# Patient Record
Sex: Female | Born: 1985 | Race: White | Hispanic: No | Marital: Married | Smoking: Never smoker
Health system: Southern US, Community
[De-identification: ages and names within clinical notes are randomized; demographics above are authoritative.]

## PROBLEM LIST (undated history)

## (undated) DIAGNOSIS — F32A Depression, unspecified: Secondary | ICD-10-CM

## (undated) DIAGNOSIS — J45909 Unspecified asthma, uncomplicated: Secondary | ICD-10-CM

## (undated) DIAGNOSIS — F329 Major depressive disorder, single episode, unspecified: Secondary | ICD-10-CM

## (undated) DIAGNOSIS — E559 Vitamin D deficiency, unspecified: Secondary | ICD-10-CM

## (undated) DIAGNOSIS — G4730 Sleep apnea, unspecified: Secondary | ICD-10-CM

## (undated) DIAGNOSIS — D509 Iron deficiency anemia, unspecified: Secondary | ICD-10-CM

---

## 2012-09-13 ENCOUNTER — Emergency Department: Payer: Self-pay | Admitting: Emergency Medicine

## 2012-09-13 LAB — URINALYSIS, COMPLETE
Bilirubin,UR: NEGATIVE
Blood: NEGATIVE
Hyaline Cast: 2
Ketone: NEGATIVE
Leukocyte Esterase: NEGATIVE
Nitrite: NEGATIVE
Protein: NEGATIVE
RBC,UR: <1 /HPF (ref 0–5)
Specific Gravity: 1.021 (ref 1.003–1.030)

## 2012-09-13 LAB — COMPREHENSIVE METABOLIC PANEL
Albumin: 3.2 g/dL — ABNORMAL LOW (ref 3.4–5.0)
BUN: 10 mg/dL (ref 7–18)
Bilirubin,Total: 0.3 mg/dL (ref 0.2–1.0)
Co2: 25 mmol/L (ref 21–32)
Creatinine: 0.61 mg/dL (ref 0.60–1.30)
EGFR (African American): >60
Osmolality: 277 (ref 275–301)
SGOT(AST): 16 U/L (ref 15–37)
Sodium: 139 mmol/L (ref 136–145)

## 2012-09-13 LAB — CBC
HCT: 39.1 % (ref 35.0–47.0)
MCHC: 33.4 g/dL (ref 32.0–36.0)
Platelet: 375 10*3/uL (ref 150–440)
RBC: 4.92 10*6/uL (ref 3.80–5.20)

## 2012-12-20 DIAGNOSIS — G43909 Migraine, unspecified, not intractable, without status migrainosus: Secondary | ICD-10-CM | POA: Insufficient documentation

## 2012-12-22 ENCOUNTER — Ambulatory Visit: Payer: Self-pay | Admitting: Neurology

## 2013-05-02 ENCOUNTER — Emergency Department: Payer: Self-pay | Admitting: Emergency Medicine

## 2013-05-02 LAB — URINALYSIS, COMPLETE
Leukocyte Esterase: NEGATIVE
Nitrite: NEGATIVE
Ph: 5 (ref 4.5–8.0)
Protein: 30
RBC,UR: 3 /HPF (ref 0–5)
Squamous Epithelial: 6

## 2013-05-02 LAB — CBC
HCT: 39.3 % (ref 35.0–47.0)
HGB: 13.2 g/dL (ref 12.0–16.0)
WBC: 11.2 10*3/uL — ABNORMAL HIGH (ref 3.6–11.0)

## 2013-05-02 LAB — COMPREHENSIVE METABOLIC PANEL
Albumin: 3.4 g/dL (ref 3.4–5.0)
Alkaline Phosphatase: 87 U/L (ref 50–136)
BUN: 11 mg/dL (ref 7–18)
Bilirubin,Total: 0.5 mg/dL (ref 0.2–1.0)
Chloride: 106 mmol/L (ref 98–107)
Co2: 22 mmol/L (ref 21–32)
Creatinine: 0.8 mg/dL (ref 0.60–1.30)
EGFR (African American): >60
EGFR (Non-African Amer.): >60
Glucose: 85 mg/dL (ref 65–99)
Osmolality: 271 (ref 275–301)
Potassium: 3.6 mmol/L (ref 3.5–5.1)
SGPT (ALT): 20 U/L (ref 12–78)

## 2013-09-26 ENCOUNTER — Emergency Department: Payer: Self-pay | Admitting: Emergency Medicine

## 2014-07-05 HISTORY — PX: LAPAROSCOPIC GASTRIC SLEEVE RESECTION: SHX5895

## 2014-11-10 IMAGING — CT CT ABD-PELV W/ CM
1 of 2 series · 15 of 32 positions shown, 19 images · non-contrast
Comparison: none

REASON FOR EXAM: (1) ruq pain; (2) transverse lower abdominal pain
COMMENTS:

PROCEDURE:     CT  - CT ABDOMEN / PELVIS  W  - May 02, 2013 [DATE]
RESULT:     History: Pain.
Comparison Study: No prior.

[Series 2: 3mm soft tissue · axial · 0.86mm/px · z∈[-552,-96]mm · 15 of 166 slices shown, 19 images]
[im 7/166  soft-tissue]
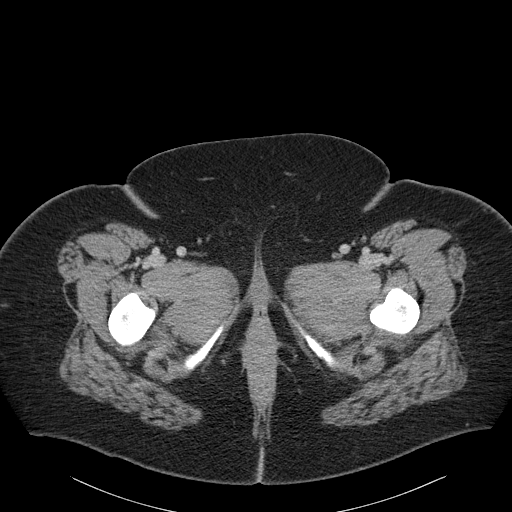
[im 7/166  bone]
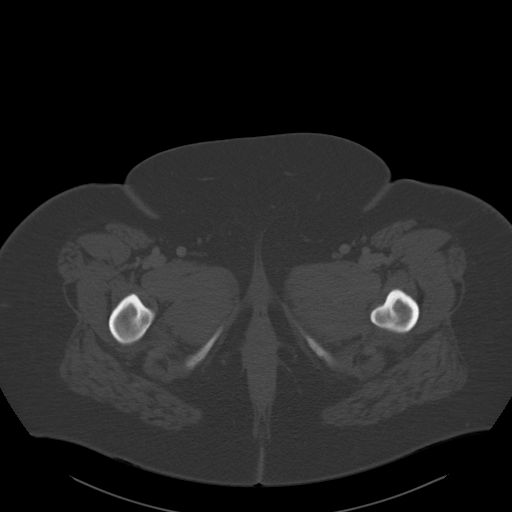
[im 21/166  soft-tissue]
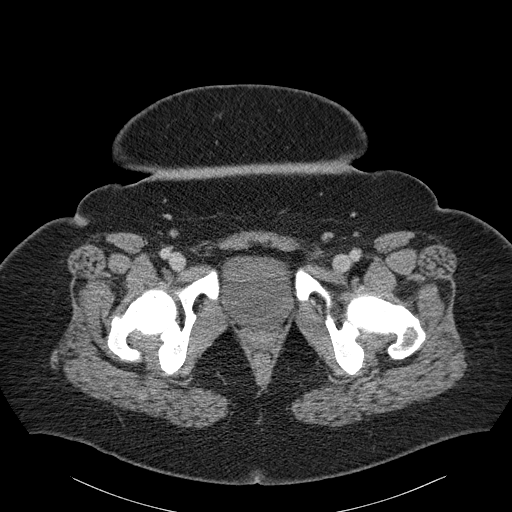
[im 35/166  soft-tissue]
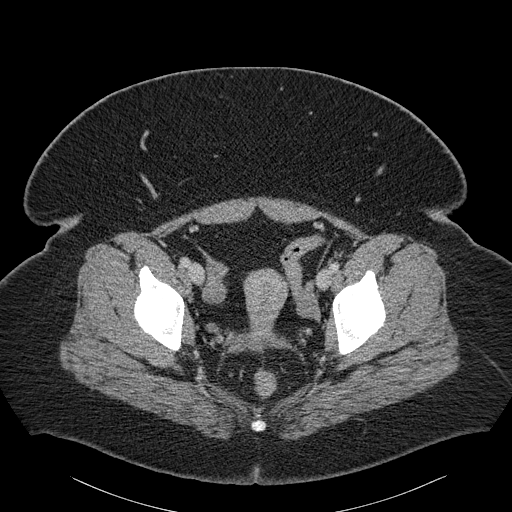
[im 49/166  soft-tissue]
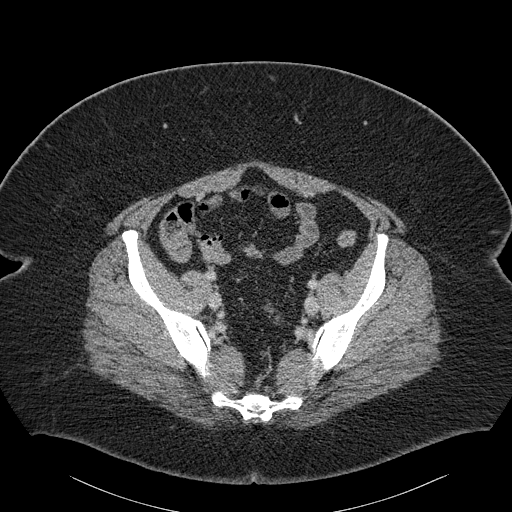
[im 56/166  soft-tissue]
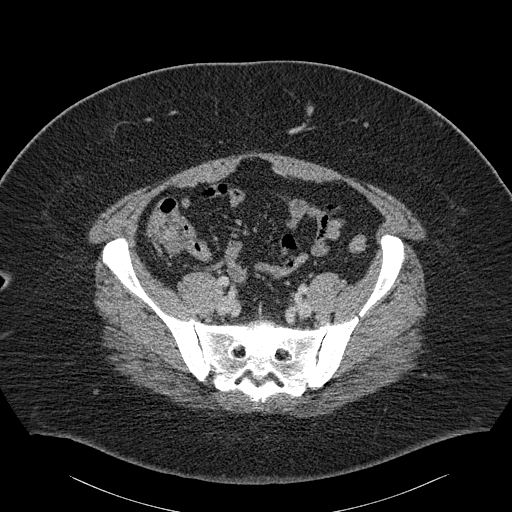
[im 69/166  soft-tissue]
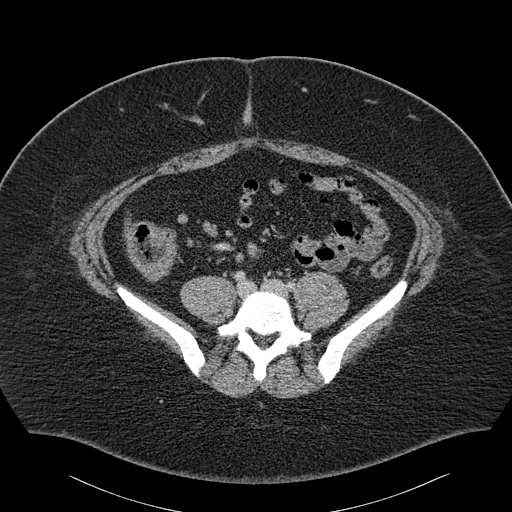
[im 83/166  soft-tissue]
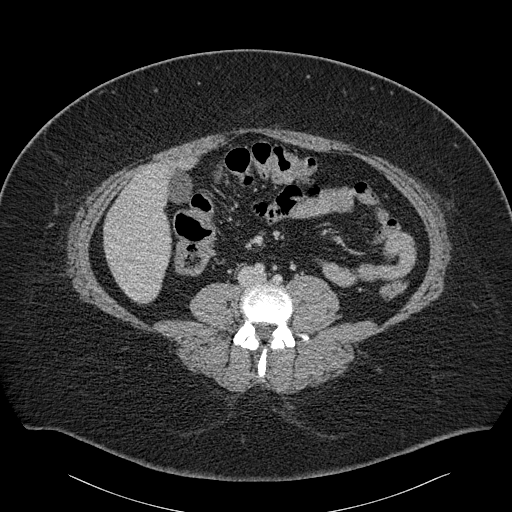
[im 97/166  soft-tissue]
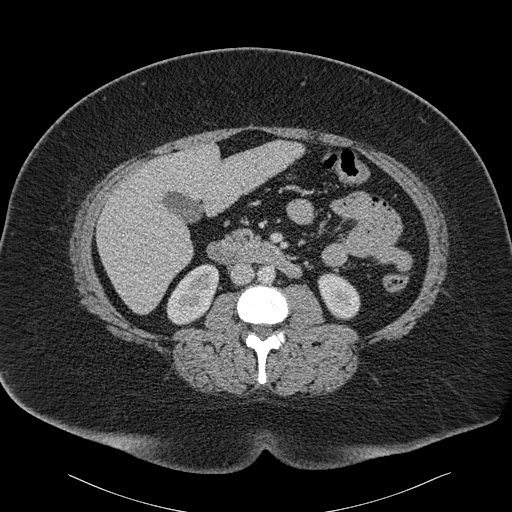
[im 111/166  soft-tissue]
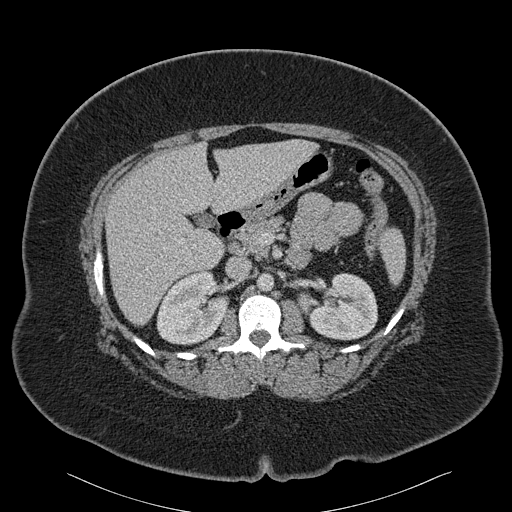
[im 111/166  bone]
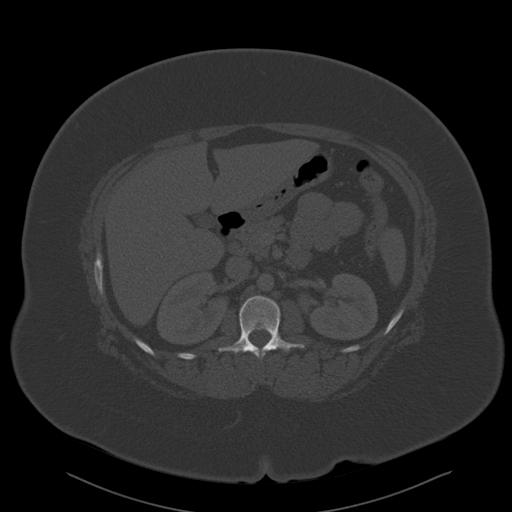
[im 117/166  soft-tissue]
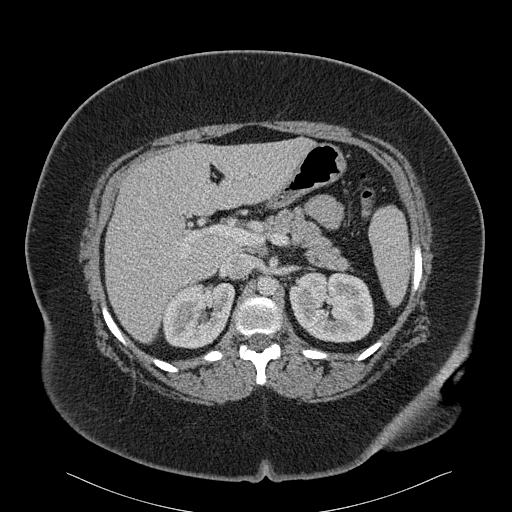
[im 131/166  soft-tissue]
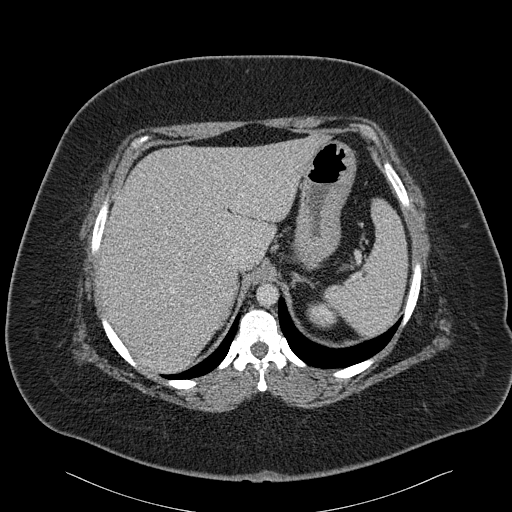
[im 138/166  lung]
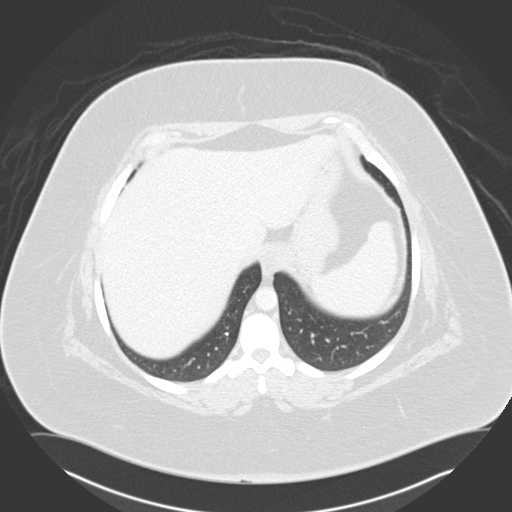
[im 145/166  soft-tissue]
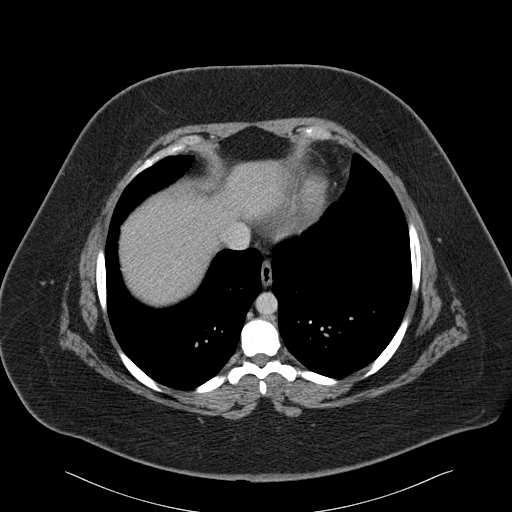
[im 145/166  lung]
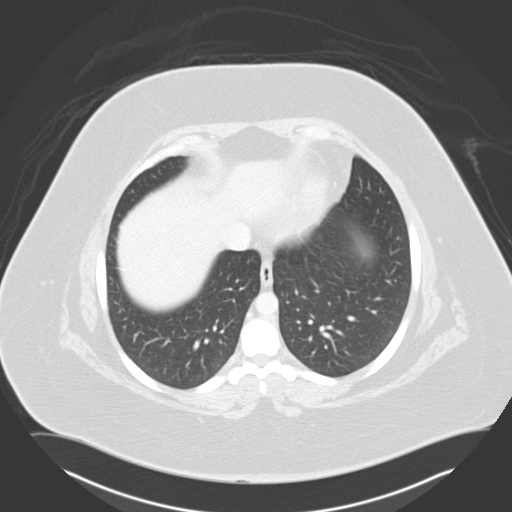
[im 152/166  lung]
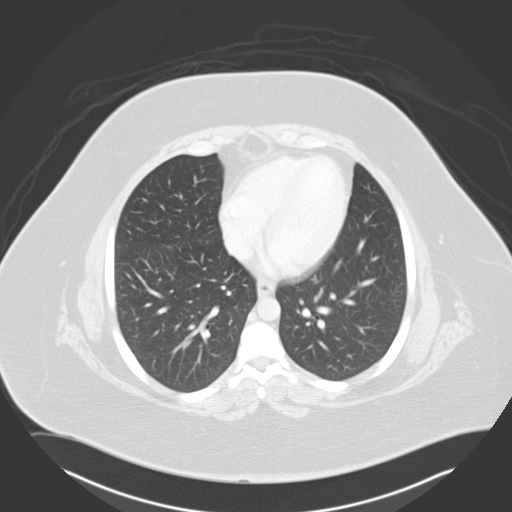
[im 159/166  soft-tissue]
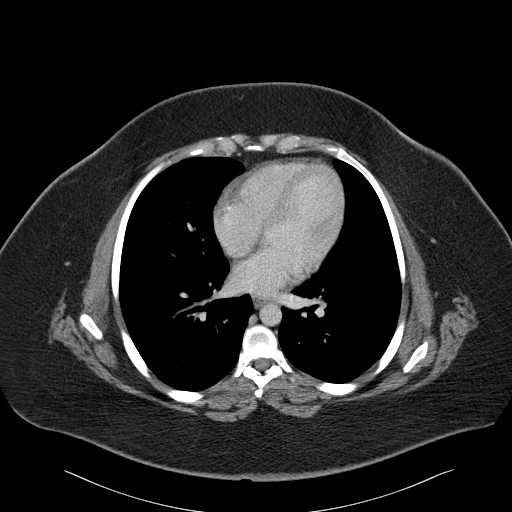
[im 159/166  lung]
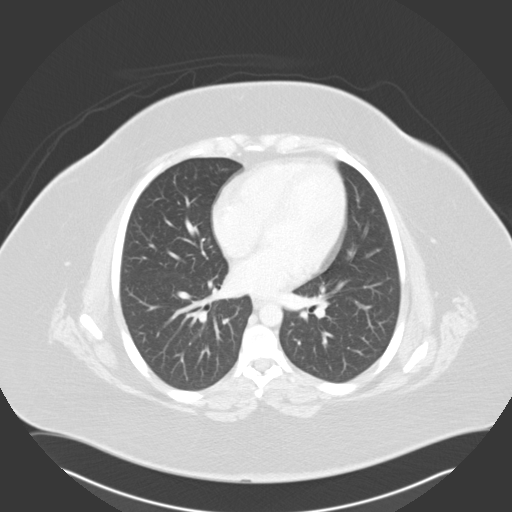

[15 of 32 positions shown; findings below may reference images not displayed]

FINDINGS: Standard CT obtained 100 cc of Bsovue-AD0. Evaluation in 3
dimensions on separate workstation obtained. The liver normal. Spleen
normal. The gallbladder nondistended. No biliary distention. Pancreas is
normal.

Adrenals normal. Kidneys are normal. Bladder is unremarkable. Uterus and
adnexa normal. No free pelvic fluid. Phleboliths.

Small mesenteric lymph nodes noted in the right lower quadrant. Mesenteric
adenitis could present in this fashion. Aorta and its branches are patent.
No aneurysm. Portal vein and splenic vein patent.

Appendix normal. No inflammatory change in right or left or quadrant. No
bowel distention. No free air. Esophagus and stomach are unremarkable.
Duodenum unremarkable.

Lung bases clear. Heart size normal. Abdominal wall intact. No acute bony
abnormality.
IMPRESSION: Findings suggesting mesenteric adenitis.
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
## 2016-08-17 ENCOUNTER — Ambulatory Visit
Admission: EM | Admit: 2016-08-17 | Discharge: 2016-08-17 | Disposition: A | Discharge status: Home / Self Care | Payer: 59 | Attending: Family Medicine | Admitting: Family Medicine

## 2016-08-17 DIAGNOSIS — R112 Nausea with vomiting, unspecified: Secondary | ICD-10-CM

## 2016-08-17 HISTORY — DX: Major depressive disorder, single episode, unspecified: F32.9

## 2016-08-17 HISTORY — DX: Depression, unspecified: F32.A

## 2016-08-17 HISTORY — DX: Unspecified asthma, uncomplicated: J45.909

## 2016-08-17 HISTORY — DX: Vitamin D deficiency, unspecified: E55.9

## 2016-08-17 HISTORY — DX: Sleep apnea, unspecified: G47.30

## 2016-08-17 HISTORY — DX: Iron deficiency anemia, unspecified: D50.9

## 2016-08-17 MED ORDER — ONDANSETRON 8 MG PO TBDP
8.0000 mg | ORAL_TABLET | Freq: Once | ORAL | Status: AC
  Administered 2016-08-17: 8 mg via ORAL

## 2016-08-17 MED ORDER — ONDANSETRON 8 MG PO TBDP: 8.0000 mg | ORAL_TABLET | Freq: Two times a day (BID) | ORAL | 0 refills | Status: AC

## 2016-08-17 NOTE — ED Provider Notes (Signed)
CSN: [id number]     Arrival date & time 08/17/16  1016 History   First MD Initiated Contact with Patient 08/17/16 1407     Chief Complaint  Patient presents with  . Emesis   (Consider location/radiation/quality/duration/timing/severity/associated sxs/prior Treatment) HPI  31 year old female who presents with nausea vomiting fatigue sweating and fevers the last 3 days. She states that it seems to be worsening. 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 The onset has been gradual over several days yesterday having the first episodes of nausea and vomiting. She has not had diarrhea although states that the stools have been sticky but without any blood or mucus. Not had any known sick contacts. Patient denies any abdominal pain        Past Medical History:  Diagnosis Date  . Asthma   . Depression   . Iron deficiency anemia   . Sleep apnea   . Vitamin D deficiency    Past Surgical History:  Procedure Laterality Date  . LAPAROSCOPIC GASTRIC SLEEVE RESECTION  2016   Family History  Problem Relation Age of Onset  . Hypertension Mother   . Epilepsy Mother   . Multiple sclerosis Mother   . Depression Mother   . Diabetes Mother   . Diverticulitis Mother   . Diabetes Father    Social History  Substance Use Topics  . Smoking status: Never Smoker  . Smokeless tobacco: Never Used  . Alcohol use No   OB History    No data available     Review of Systems  Constitutional: Positive for activity change, appetite change, chills, fatigue and fever.  Gastrointestinal: Positive for diarrhea, nausea and vomiting. Negative for abdominal distention, abdominal pain, blood in stool and constipation.  All other systems reviewed and are negative.   Allergies  Measles and rubella virus vaccine and Reglan [metoclopramide]  Home Medications   Prior to Admission medications   Medication Sig Start Date End Date Taking? Authorizing Provider   albuterol (PROVENTIL HFA;VENTOLIN HFA) 108 (90 Base) MCG/ACT inhaler Inhale into the lungs every 6 (six) hours as needed for wheezing or shortness of breath.   Yes Historical Provider, MD  amitriptyline (ELAVIL) 50 MG tablet Take 50 mg by mouth at bedtime.   Yes Historical Provider, MD  cholecalciferol (VITAMIN D) 1000 units tablet Take 1,000 Units by mouth daily.   Yes Historical Provider, MD  DULoxetine (CYMBALTA) 60 MG capsule Take 60 mg by mouth daily.   Yes Historical Provider, MD  ferrous sulfate 325 (65 FE) MG EC tablet Take 325 mg by mouth 3 (three) times daily with meals.   Yes Historical Provider, MD  norethindrone-ethinyl estradiol (JUNEL FE,GILDESS FE,LOESTRIN FE) 1-20 MG-MCG tablet Take 1 tablet by mouth daily.   Yes Historical Provider, MD  omeprazole (PRILOSEC) 20 MG capsule Take 20 mg by mouth daily.   Yes Historical Provider, MD  ondansetron (ZOFRAN ODT) 8 MG disintegrating tablet Take 1 tablet (8 mg total) by mouth 2 (two) times daily. 08/17/16   Lutricia FeilWilliam P Shae Augello, PA-C   Meds Ordered and Administered this Visit   Medications  ondansetron (ZOFRAN-ODT) disintegrating tablet 8 mg (8 mg Oral Given 08/17/16 1420)    BP (!) 148/88 (BP Location: Left Arm)   Pulse 92   Temp 98.2 F (36.8 C) (Oral)   Resp 16   Ht 5\' 3"  (1.6 m)   Wt (!) 320 lb (145.2 kg)   LMP 08/03/2016   SpO2 100%  BMI 56.69 kg/m  Orthostatic VS for the past 24 hrs:  BP- Lying Pulse- Lying BP- Sitting Pulse- Sitting BP- Standing at 0 minutes Pulse- Standing at 0 minutes  08/17/16 1421 (!) 161/94 88 (!) 153/98 94 (!) 168/102 91    Physical Exam  Constitutional: She appears well-developed and well-nourished. No distress.  HENT:  Head: Normocephalic and atraumatic.  Right Ear: External ear normal.  Left Ear: External ear normal.  Nose: Nose normal.  Mouth/Throat: Oropharynx is clear and moist. No oropharyngeal exudate.  Eyes: EOM are normal. Pupils are equal, round, and reactive to light.  Neck: Normal  range of motion. Neck supple.  Pulmonary/Chest: Effort normal and breath sounds normal. No respiratory distress. She has no wheezes. She has no rales.  Abdominal: Soft. Bowel sounds are normal. She exhibits no distension and no mass. There is tenderness. There is no rebound and no guarding.  Patient has mild tenderness in the right mid quadrant and periumbilical. There is no rebound or guarding present. Ipatient is extremely obese.  Lymphadenopathy:    She has no cervical adenopathy.  Skin: She is not diaphoretic.  Nursing note and vitals reviewed.   Urgent Care Course     Procedures (including critical care time)  Labs Review Labs Reviewed - No data to display  Imaging Review No results found.   Visual Acuity Review  Right Eye Distance:   Left Eye Distance:   Bilateral Distance:    Right Eye Near:   Left Eye Near:    Bilateral Near:     Medications  ondansetron (ZOFRAN-ODT) disintegrating tablet 8 mg (8 mg Oral Given 08/17/16 1420)      MDM   1. Non-intractable vomiting with nausea, unspecified vomiting type    Discharge Medication List as of 08/17/2016  3:05 PM    START taking these medications   Details  ondansetron (ZOFRAN ODT) 8 MG disintegrating tablet Take 1 tablet (8 mg total) by mouth 2 (two) times daily., Starting Tue 08/17/2016, Normal      Plan: 1. Test/x-ray results and diagnosis reviewed with patient 2. rx as per orders; risks, benefits, potential side effects reviewed with patient 3. Recommend supportive treatment with fluids fluids fluids. Use Zofran for nausea. He does decide to resume food to start with a BRAT diet. If she is not improving she should follow-up with her primary care doctor Dayna Barker. She has high fevers increasing abdominal pain or intractable vomiting she should go to the emergency room. 4. F/u prn if symptoms worsen or don't improve     Lutricia Feil, PA-C 08/17/16 1513

## 2016-08-17 NOTE — ED Triage Notes (Signed)
Patient complains of nausea, vomiting, fatigue, sweating, fevers that started a few days ago and have been worsening. Patient states that symptoms have been constant.

## 2018-10-06 DIAGNOSIS — F419 Anxiety disorder, unspecified: Secondary | ICD-10-CM | POA: Insufficient documentation

## 2020-03-05 DIAGNOSIS — I10 Essential (primary) hypertension: Secondary | ICD-10-CM | POA: Insufficient documentation

## 2020-11-24 NOTE — Progress Notes (Signed)
[facility] [address] [place], [place] [address]  Pulmonary Sleep Medicine   Office Visit Note  Patient Name: Debra Morales DOB: 06-01-1986 MRN [id number]  I connected with  Barnet Glasgow on 11/25/20 by a video enabled telemedicine application and verified that I am speaking with the correct person using two identifiers.   I discussed the limitations of evaluation and management by telemedicine. The patient expressed understanding and agreed to proceed.   Chief Complaint: Obstructive Sleep Apnea visit  Brief History:  Arlette is seen today for initial consult to obtain a replacement CPAP machine. Her current machine is malfunctioning and is no longer recording data.  This machine is a little over 4 years old. Keeps telling her there is a clog when there is not and the air pattern is irregular.  Stopped recording in April. Motor hours have been going up but the therapy compliance data is not recording. The patient has a 10 year history of sleep apnea. Patient is using PAP nightly.  The patient feels better after sleeping with PAP.  The patient reports benefit from PAP use. Reported sleepiness is  Improved and the Epworth Sleepiness Score is 7 out of 24. The patient does take naps on weekends. The patient complains of the following: machine not operating properly.  The compliance download shows 83% compliance with an average use time of 8.9 hours. The AHI is 0.9.  The patient does not complain of limb movements disrupting sleep.  ROS  General: (-) fever, (-) chills, (-) night sweat Nose and Sinuses: (-) nasal stuffiness or itchiness, (-) postnasal drip, (-) nosebleeds, (-) sinus trouble. Mouth and Throat: (-) sore throat, (-) hoarseness. Neck: (-) swollen glands, (-) enlarged thyroid, (-) neck pain. Respiratory: - cough, - shortness of breath, - wheezing. Neurologic: + numbness, + tingling. Psychiatric: + anxiety, + depression   Current  Medication: Outpatient Encounter Medications as of 11/25/2020  Medication Sig  . ARIPiprazole (ABILIFY) 5 MG tablet Take 1/2 tab PO daily for 4 days, then 1 tab PO daily thereafter  . clonazePAM (KLONOPIN) 0.5 MG tablet Take 2 tablets PO QAM, 1 tablet PO Qnoon, and 1 tablet PO QHS  . DULoxetine (CYMBALTA) 30 MG capsule Take by mouth.  . hydrochlorothiazide (HYDRODIURIL) 25 MG tablet Take 1 tablet by mouth daily.  . valsartan (DIOVAN) 160 MG tablet Take by mouth.  Marland Kitchen albuterol (PROVENTIL HFA;VENTOLIN HFA) 108 (90 Base) MCG/ACT inhaler Inhale into the lungs every 6 (six) hours as needed for wheezing or shortness of breath.  Marland Kitchen amitriptyline (ELAVIL) 50 MG tablet Take 50 mg by mouth at bedtime.  Marland Kitchen aspirin-acetaminophen-caffeine (EXCEDRIN MIGRAINE) 250-250-65 MG tablet Take by mouth.  . cholecalciferol (VITAMIN D) 1000 units tablet Take 1,000 Units by mouth daily.  . DULoxetine (CYMBALTA) 60 MG capsule Take 60 mg by mouth daily.  . ferrous sulfate 325 (65 FE) MG EC tablet Take 325 mg by mouth 3 (three) times daily with meals.  . norethindrone-ethinyl estradiol (JUNEL FE,GILDESS FE,LOESTRIN FE) 1-20 MG-MCG tablet Take 1 tablet by mouth daily.  Marland Kitchen omeprazole (PRILOSEC) 20 MG capsule Take 20 mg by mouth daily.  . ondansetron (ZOFRAN ODT) 8 MG disintegrating tablet Take 1 tablet (8 mg total) by mouth 2 (two) times daily.   No facility-administered encounter medications on file as of 11/25/2020.    Surgical History: Past Surgical History:  Procedure Laterality Date  . LAPAROSCOPIC GASTRIC SLEEVE RESECTION  2016    Medical History: Past Medical History:  Diagnosis Date  .  Asthma   . Depression   . Iron deficiency anemia   . Sleep apnea   . Vitamin D deficiency     Family History: Non contributory to the present illness  Social History: Social History   Socioeconomic History  . Marital status: Married    Spouse name: Not on file  . Number of children: Not on file  . Years of education:  Not on file  . Highest education level: Not on file  Occupational History  . Not on file  Tobacco Use  . Smoking status: Never Smoker  . Smokeless tobacco: Never Used  Substance and Sexual Activity  . Alcohol use: No  . Drug use: No  . Sexual activity: Not on file  Other Topics Concern  . Not on file  Social History Narrative  . Not on file   Social Determinants of Health   Financial Resource Strain: Not on file  Food Insecurity: Not on file  Transportation Needs: Not on file  Physical Activity: Not on file  Stress: Not on file  Social Connections: Not on file  Intimate Partner Violence: Not on file    Vital Signs: There were no vitals taken for this visit.  Examination: General Appearance: The patient is well-developed, well-nourished, and in no distress. Neck Circumference:  Skin: Gross inspection of skin unremarkable. Head: normocephalic, no gross deformities. Eyes: no gross deformities noted. ENT: ears appear grossly normal Neurologic: Alert and oriented. No involuntary movements.    EPWORTH SLEEPINESS SCALE:  Scale:  (0)= no chance of dozing; (1)= slight chance of dozing; (2)= moderate chance of dozing; (3)= high chance of dozing  Chance  Situtation    Sitting and reading: 1    Watching TV: 3    Sitting Inactive in public: 0    As a passenger in car: 1      Lying down to rest: 2    Sitting and talking: 0    Sitting quielty after lunch: 0    In a car, stopped in traffic: 0   TOTAL SCORE:   7 out of 24    SLEEP STUDIES:  1. Split 05/13/11    CPAP COMPLIANCE DATA:  Date Range: 11/25/19-5/22-22  Average Daily Use: 8.9 hours  Median Use: 8.3  Compliance for > 4 Hours: 83%   AHI: 0.9 respiratory events per hour  Days Used: 83%  Mask Leak: 11.8  95th Percentile Pressure: 10         LABS: No results found for this or any previous visit (from the past 2160 hour(s)).  Radiology: No results found.  No results  found.  No results found.    Assessment and Plan: Patient Active Problem List   Diagnosis Date Noted  . Asthma, stable 11/25/2020  . Depression 11/25/2020  . Benign essential HTN 03/05/2020  . Anxiety 10/06/2018  . Migraines 12/20/2012      The patient does tolerate PAP and reports benefit from PAP use. The patient was reminded how to adjust mask fit and advised to change supplies. The patient was also counselled on nightly use. The compliance is excellent. The AHI is 0.9. Machine is in need of repair and patient plans to try for repair and rent a machine in the meantime, though if repair not feasible and machine is past end of life will need to be replaced. Will need to schedule a 30+day follow up after setup if a replacement machine is ordered.   1. OSA (obstructive sleep apnea) Continue excellent compliance.  2. CPAP use counseling CPAP couseling-Discussed importance of adequate CPAP use as well as proper care and cleaning techniques of machine and all supplies.  3. Benign essential HTN Continue current medication and f/u with PCP.  4. Mild intermittent stable asthma Continue inhaler as prescribed  5. Moderate episode of recurrent major depressive disorder (HCC) Followed by psych  6. Migraine without status migrainosus, not intractable, unspecified migraine type Continue current medication and f/u with PCP.   General Counseling: I have discussed the findings of the evaluation and examination with Darlisa.  I have also discussed any further diagnostic evaluation thatmay be needed or ordered today. Rhaelyn verbalizes understanding of the findings of todays visit. We also reviewed her medications today and discussed drug interactions and side effects including but not limited excessive drowsiness and altered mental states. We also discussed that there is always a risk not just to her but also people around her. she has been encouraged to call the office with any questions or  concerns that should arise related to todays visit.  No orders of the defined types were placed in this encounter.       I have personally obtained a history, examined the patient, evaluated laboratory and imaging results, formulated the assessment and plan and placed orders.  This patient was seen by Lynn Ito, PA-C in collaboration with Dr. Freda Munro as a part of collaborative care agreement.   Valentino Hue Sol Blazing, PhD, FAASM  Diplomate, American Board of Sleep Medicine    Yevonne Pax, MD [facility] Diplomate ABMS Pulmonary and Critical Care Medicine Sleep medicine

## 2020-11-25 ENCOUNTER — Ambulatory Visit (INDEPENDENT_AMBULATORY_CARE_PROVIDER_SITE_OTHER): Payer: No Typology Code available for payment source | Admitting: Internal Medicine

## 2020-11-25 DIAGNOSIS — G4733 Obstructive sleep apnea (adult) (pediatric): Secondary | ICD-10-CM | POA: Diagnosis not present

## 2020-11-25 DIAGNOSIS — Z7189 Other specified counseling: Secondary | ICD-10-CM | POA: Diagnosis not present

## 2020-11-25 DIAGNOSIS — I10 Essential (primary) hypertension: Secondary | ICD-10-CM

## 2020-11-25 DIAGNOSIS — G43909 Migraine, unspecified, not intractable, without status migrainosus: Secondary | ICD-10-CM

## 2020-11-25 DIAGNOSIS — J4520 Mild intermittent asthma, uncomplicated: Secondary | ICD-10-CM | POA: Diagnosis not present

## 2020-11-25 DIAGNOSIS — F331 Major depressive disorder, recurrent, moderate: Secondary | ICD-10-CM

## 2020-11-25 DIAGNOSIS — J45909 Unspecified asthma, uncomplicated: Secondary | ICD-10-CM | POA: Insufficient documentation

## 2020-11-25 DIAGNOSIS — F32A Depression, unspecified: Secondary | ICD-10-CM | POA: Insufficient documentation

## 2020-11-25 NOTE — Patient Instructions (Signed)

## 2021-05-19 ENCOUNTER — Other Ambulatory Visit: Payer: Self-pay | Admitting: Neurology

## 2021-05-19 DIAGNOSIS — R200 Anesthesia of skin: Secondary | ICD-10-CM

## 2021-05-19 DIAGNOSIS — R519 Headache, unspecified: Secondary | ICD-10-CM

## 2021-05-19 DIAGNOSIS — R208 Other disturbances of skin sensation: Secondary | ICD-10-CM

## 2021-05-21 ENCOUNTER — Other Ambulatory Visit: Payer: Self-pay

## 2021-05-21 ENCOUNTER — Ambulatory Visit
Admission: RE | Admit: 2021-05-21 | Discharge: 2021-05-21 | Disposition: A | Discharge status: Home / Self Care | Payer: No Typology Code available for payment source | Source: Ambulatory Visit | Attending: Neurology | Admitting: Neurology

## 2021-05-21 DIAGNOSIS — R200 Anesthesia of skin: Secondary | ICD-10-CM | POA: Insufficient documentation

## 2021-05-21 DIAGNOSIS — R208 Other disturbances of skin sensation: Secondary | ICD-10-CM | POA: Insufficient documentation

## 2021-05-21 DIAGNOSIS — R519 Headache, unspecified: Secondary | ICD-10-CM | POA: Insufficient documentation

## 2021-06-25 ENCOUNTER — Other Ambulatory Visit: Payer: Self-pay | Admitting: Neurology

## 2021-06-25 DIAGNOSIS — M5416 Radiculopathy, lumbar region: Secondary | ICD-10-CM

## 2021-06-25 DIAGNOSIS — G8929 Other chronic pain: Secondary | ICD-10-CM

## 2021-07-24 ENCOUNTER — Ambulatory Visit
Admission: RE | Admit: 2021-07-24 | Discharge: 2021-07-24 | Disposition: A | Discharge status: Home / Self Care | Payer: 59 | Source: Ambulatory Visit | Attending: Neurology | Admitting: Neurology

## 2021-07-24 ENCOUNTER — Other Ambulatory Visit: Payer: Self-pay

## 2021-07-24 DIAGNOSIS — G8929 Other chronic pain: Secondary | ICD-10-CM

## 2021-07-24 DIAGNOSIS — M5416 Radiculopathy, lumbar region: Secondary | ICD-10-CM

## 2021-09-30 ENCOUNTER — Ambulatory Visit
Payer: 59 | Attending: Student in an Organized Health Care Education/Training Program | Admitting: Student in an Organized Health Care Education/Training Program

## 2021-09-30 ENCOUNTER — Encounter: Payer: Self-pay | Admitting: Student in an Organized Health Care Education/Training Program

## 2021-09-30 VITALS — BP 148/88 | HR 92 | Temp 98.1°F | Resp 16 | Ht 63.0 in | Wt 356.0 lb

## 2021-09-30 DIAGNOSIS — M5417 Radiculopathy, lumbosacral region: Secondary | ICD-10-CM | POA: Diagnosis not present

## 2021-09-30 DIAGNOSIS — M5416 Radiculopathy, lumbar region: Secondary | ICD-10-CM | POA: Insufficient documentation

## 2021-09-30 DIAGNOSIS — M5126 Other intervertebral disc displacement, lumbar region: Secondary | ICD-10-CM | POA: Insufficient documentation

## 2021-09-30 NOTE — Progress Notes (Signed)
Patient: Debra Morales  Service Category: E/M  Provider: Gillis Santa, MD  ?DOB: 11-20-1985  DOS: 09/30/2021  Referring Provider: Doyle Askew, MD  ?MRN: [id number]  Setting: Ambulatory outpatient  PCP: Gayland Curry, MD  ?Type: New Patient  Specialty: Interventional Pain Management    ?Location: Office  Delivery: Face-to-face    ? ?Primary Reason(s) for Visit: Encounter for initial evaluation of one or more chronic problems (new to examiner) potentially causing chronic pain, and posing a threat to normal musculoskeletal function. (Level of risk: High) ?CC: Back Pain (lower) ? ?HPI  ?Debra Morales is a 36 y.o. year old, female patient, who comes for the first time to our practice referred by Girtha Hake I, MD for our initial evaluation of her chronic pain. She has Anxiety; Asthma, stable; Benign essential HTN; Depression; Migraines; Lumbar radiculopathy; Protrusion of lumbar intervertebral disc; and Lumbosacral radiculopathy at L5 (right) on their problem list. Today she comes in for evaluation of her Back Pain (lower) ? ?Pain Assessment: ?Location:   Back ?Radiating: right hip and right leg ?Onset: More than a month ago ?Duration: Chronic pain ?Quality: Aching, Sharp ?Severity: 6 /10 (subjective, self-reported pain score)  ?Effect on ADL: difficulty performing daily activities ?Timing: Constant ?Modifying factors: positioning ?BP: (!) 148/88  HR: 92 ? ?Onset and Duration: Gradual and Present longer than 3 months ?Cause of pain: Unknown ?Severity: No change since onset, NAS-11 at its worse: 9/10, NAS-11 at its best: 4/10, NAS-11 now: 10/10, and NAS-11 on the average: 6/10 ?Timing: Afternoon, Night, During activity or exercise, and After activity or exercise ?Aggravating Factors: Bending, Intercourse (sex), Kneeling, Lifiting, Prolonged sitting, Prolonged standing, Squatting, Stooping , Twisting, Walking, Walking uphill, and Walking downhill ?Alleviating Factors: Stretching, Lying down,  Resting, and physical therapy ?Associated Problems: Day-time cramps, Depression, Fatigue, Inability to concentrate, Numbness, Spasms, Swelling, Tingling, Weakness, Pain that wakes patient up, and Pain that does not allow patient to sleep ?Quality of Pain: Aching, Agonizing, Burning, Constant, Deep, Pressure-like, Pulsating, Sharp, Shooting, Stabbing, and Tingling ?Previous Examinations or Tests: CT scan, MRI scan, and Nerve conduction test ?Previous Treatments: Physical Therapy ? ?Debra Morales is a pleasant 36 year old female, history of morbid obesity, who presents as a referral from Dr. Alba Destine for right L5 transforaminal epidural steroid injection given her body habitus.  Patient is complaining of low back pain that radiates down her right leg.  This is been going on for approximately 6 months.  No inciting or traumatic event.  She does have intermittent numbness and tingling along her right posterior lateral leg.  No bowel or bladder dysfunction.  She has been working with physical therapy.  She has tried naproxen, ibuprofen, Tylenol, gabapentin with limited benefit.  She is currently on gabapentin managed by Dr. Alba Destine.  She also has a history of migraines, depression and anxiety. ? ?I informed the patient that I will be assisting with her transforaminal epidural steroid injection and that she will follow back up with Dr. Alba Destine for further management. ? ?Meds  ? ?Current Outpatient Medications:  ?  albuterol (PROVENTIL HFA;VENTOLIN HFA) 108 (90 Base) MCG/ACT inhaler, Inhale into the lungs every 6 (six) hours as needed for wheezing or shortness of breath., Disp: , Rfl:  ?  ARIPiprazole (ABILIFY) 5 MG tablet, 10 mg 2 (two) times daily., Disp: , Rfl:  ?  ascorbic acid (VITAMIN C) 500 MG tablet, Take by mouth., Disp: , Rfl:  ?  aspirin-acetaminophen-caffeine (EXCEDRIN MIGRAINE) 250-250-65 MG tablet, Take by mouth., Disp: , Rfl:  ?  azelastine (ASTELIN) 0.1 % nasal spray, Place into the nose., Disp: , Rfl:  ?   clonazePAM (KLONOPIN) 0.5 MG tablet, 0.5 mg. 1/2 tab two times per day and prn, Disp: , Rfl:  ?  DULoxetine (CYMBALTA) 30 MG capsule, Take by mouth daily., Disp: , Rfl:  ?  DULoxetine (CYMBALTA) 60 MG capsule, Take 60 mg by mouth daily., Disp: , Rfl:  ?  ferrous sulfate 325 (65 FE) MG EC tablet, Take 325 mg by mouth daily., Disp: , Rfl:  ?  gabapentin (NEURONTIN) 300 MG capsule, Take 300 mg by mouth 3 (three) times daily. 1 in a.m., 1 at noon, 2 at night, Disp: , Rfl:  ?  hydrochlorothiazide (HYDRODIURIL) 25 MG tablet, Take 1 tablet by mouth daily., Disp: , Rfl:  ?  norethindrone-ethinyl estradiol (JUNEL FE,GILDESS FE,LOESTRIN FE) 1-20 MG-MCG tablet, Take 1 tablet by mouth daily., Disp: , Rfl:  ?  nortriptyline (PAMELOR) 50 MG capsule, Take 50 mg by mouth at bedtime. 25 mg in a.m., 75 mg at p.m., Disp: , Rfl:  ?  omeprazole (PRILOSEC) 20 MG capsule, Take 20 mg by mouth daily., Disp: , Rfl:  ?  Prenatal 28-0.8 MG TABS, Take by mouth., Disp: , Rfl:  ?  topiramate (TOPAMAX) 25 MG tablet, TAKE 25 MG AT NIGHT FOR A WEEK THEN INCREASE TO 50 MG AT NIGHT FOR A WEEK THEN INCREASE TO 75 MG AT NIGHT, Disp: , Rfl:  ?  amitriptyline (ELAVIL) 50 MG tablet, Take 50 mg by mouth at bedtime. (Patient not taking: Reported on 09/30/2021), Disp: , Rfl:  ?  cholecalciferol (VITAMIN D) 1000 units tablet, Take 1,000 Units by mouth daily. (Patient not taking: Reported on 09/30/2021), Disp: , Rfl:  ?  hydrochlorothiazide (HYDRODIURIL) 25 MG tablet, Take 1 tablet by mouth daily., Disp: , Rfl:  ?  ondansetron (ZOFRAN ODT) 8 MG disintegrating tablet, Take 1 tablet (8 mg total) by mouth 2 (two) times daily. (Patient not taking: Reported on 09/30/2021), Disp: 6 tablet, Rfl: 0 ?  valsartan (DIOVAN) 160 MG tablet, Take by mouth., Disp: , Rfl:  ? ?Imaging Review  ? ?MR LUMBAR SPINE WO CONTRAST ? ?Narrative ?CLINICAL DATA:  Chronic low back pain on the right side. The patient ?developed burning sensation, numbness and weakness in right thigh ?over the  past 3-4 months. No known injury. ? ?EXAM: ?MRI LUMBAR SPINE WITHOUT CONTRAST ? ?TECHNIQUE: ?Multiplanar, multisequence MR imaging of the lumbar spine was ?performed. No intravenous contrast was administered. ? ?COMPARISON:  None. ? ?FINDINGS: ?Segmentation:  Standard. ? ?Alignment:  Normal. ? ?Vertebrae:  No fracture, evidence of discitis, or bone lesion. ? ?Conus medullaris and cauda equina: Conus extends to the T12-L1 ?level. Conus and cauda equina appear normal. ? ?Paraspinal and other soft tissues: Negative. ? ?Disc levels: ? ?T11-12 to L2-3 are negative. ? ?L3-4: A right paracentral disc protrusion indents the ventral thecal ?sac and could irritate the descending right L3 root. The foramina ?are open. ? ?L4-5: A right paracentral disc protrusion indents the ventral thecal ?sac and encroaches on the descending right L5 root. The neural ?foramina are open. ? ?L5-S1: Annular fissure and a shallow disc bulge without stenosis. ? ?IMPRESSION: ?A right paracentral disc protrusion at L4-5 impinges on the ?descending right L5 root and indents the ventral thecal sac. The ?neural foramina are open. ? ?A right paracentral disc protrusion at L3-4 mildly indents the ?ventral thecal sac and could irritate the descending right L3 root. ? ? ?Electronically Signed ?By: Drusilla Kanner  M.D. ?On: 07/24/2021 10:06 ?Complexity Note: Imaging results reviewed. Results shared with Ms. Reyman, using Layman's terms.              ?          ? ?ROS  ?Cardiovascular: High blood pressure ?Pulmonary or Respiratory: Wheezing and difficulty taking a deep full breath (Asthma), Shortness of breath, Snoring , and Temporary stoppage of breathing during sleep ?Neurological: Curved spine ?Psychological-Psychiatric: Anxiousness, Depressed, and Prone to panicking ?Gastrointestinal: Reflux or heatburn ?Genitourinary: Difficulty emptying the bladder or controlling the flow of urine (Neurogenic bladder) ?Hematological: Weakness due to low blood  hemoglobin or red blood cell count (Anemia) ?Endocrine: No reported endocrine signs or symptoms such as high or low blood sugar, rapid heart rate due to high thyroid levels, obesity or weight gain due to slow thyroid or thy

## 2021-09-30 NOTE — Patient Instructions (Signed)

## 2021-09-30 NOTE — Progress Notes (Signed)
Safety precautions to be maintained throughout the outpatient stay will include: orient to surroundings, keep bed in low position, maintain call bell within reach at all times, provide assistance with transfer out of bed and ambulation.  

## 2021-10-12 ENCOUNTER — Ambulatory Visit (HOSPITAL_BASED_OUTPATIENT_CLINIC_OR_DEPARTMENT_OTHER): Payer: 59 | Admitting: Student in an Organized Health Care Education/Training Program

## 2021-10-12 ENCOUNTER — Encounter: Payer: Self-pay | Admitting: Student in an Organized Health Care Education/Training Program

## 2021-10-12 ENCOUNTER — Ambulatory Visit
Admission: RE | Admit: 2021-10-12 | Discharge: 2021-10-12 | Disposition: A | Discharge status: Home / Self Care | Payer: 59 | Source: Ambulatory Visit | Attending: Student in an Organized Health Care Education/Training Program | Admitting: Student in an Organized Health Care Education/Training Program

## 2021-10-12 VITALS — BP 155/102 | HR 91 | Temp 98.1°F | Resp 26 | Ht 63.0 in | Wt 356.0 lb

## 2021-10-12 DIAGNOSIS — M5116 Intervertebral disc disorders with radiculopathy, lumbar region: Secondary | ICD-10-CM | POA: Diagnosis present

## 2021-10-12 DIAGNOSIS — M5417 Radiculopathy, lumbosacral region: Secondary | ICD-10-CM | POA: Diagnosis not present

## 2021-10-12 DIAGNOSIS — M5416 Radiculopathy, lumbar region: Secondary | ICD-10-CM | POA: Diagnosis not present

## 2021-10-12 DIAGNOSIS — M5126 Other intervertebral disc displacement, lumbar region: Secondary | ICD-10-CM | POA: Diagnosis not present

## 2021-10-12 MED ORDER — DEXAMETHASONE SODIUM PHOSPHATE 10 MG/ML IJ SOLN
INTRAMUSCULAR | Status: AC
Start: 2021-10-12 — End: ?
  Filled 2021-10-12: qty 1

## 2021-10-12 MED ORDER — LIDOCAINE HCL 2 % IJ SOLN
20.0000 mL | Freq: Once | INTRAMUSCULAR | Status: AC
  Administered 2021-10-12: 400 mg

## 2021-10-12 MED ORDER — IOHEXOL 180 MG/ML  SOLN
10.0000 mL | Freq: Once | INTRAMUSCULAR | Status: AC
  Administered 2021-10-12: 10 mL via EPIDURAL
  Filled 2021-10-12: qty 20

## 2021-10-12 MED ORDER — LIDOCAINE HCL 2 % IJ SOLN
INTRAMUSCULAR | Status: AC
Start: 2021-10-12 — End: ?
  Filled 2021-10-12: qty 20

## 2021-10-12 MED ORDER — SODIUM CHLORIDE 0.9% FLUSH
1.0000 mL | Freq: Once | INTRAVENOUS | Status: AC
  Administered 2021-10-12: 1 mL

## 2021-10-12 MED ORDER — ROPIVACAINE HCL 2 MG/ML IJ SOLN
INTRAMUSCULAR | Status: AC
  Filled 2021-10-12: qty 20

## 2021-10-12 MED ORDER — ROPIVACAINE HCL 2 MG/ML IJ SOLN
1.0000 mL | Freq: Once | INTRAMUSCULAR | Status: AC
  Administered 2021-10-12: 1 mL via EPIDURAL

## 2021-10-12 MED ORDER — DEXAMETHASONE SODIUM PHOSPHATE 10 MG/ML IJ SOLN
10.0000 mg | Freq: Once | INTRAMUSCULAR | Status: AC
  Administered 2021-10-12: 10 mg

## 2021-10-12 MED ORDER — SODIUM CHLORIDE (PF) 0.9 % IJ SOLN
INTRAMUSCULAR | Status: AC
  Filled 2021-10-12: qty 10

## 2021-10-12 NOTE — Progress Notes (Signed)
PROVIDER NOTE: Interpretation of information contained herein should be left to medically-trained personnel. Specific patient instructions are provided elsewhere under "Patient Instructions" section of medical record. This document was created in part using STT-dictation technology, any transcriptional errors that may result from this process are unintentional.  ?Patient: Debra Morales ?Type: Established ?DOB: Jun 06, 1986 ?MRN: [id number] ?PCP: Leim Fabry, MD  Service: Procedure ?DOS: 10/12/2021 ?Setting: Ambulatory ?Location: Ambulatory outpatient facility ?Delivery: Face-to-face Provider: Edward Jolly, MD ?Specialty: Interventional Pain Management ?Specialty designation: 09 ?Location: Outpatient facility ?Ref. Prov.: Leim Fabry, MD   ? ?Primary Reason for Visit: Interventional Pain Management Treatment. ?CC: Back Pain (lower) ?  ?Procedure:          ? Type: Trans-Foraminal Epidural Steroid Injection (Lumbar) #1  ?Laterality: Right  ?Level: L5           ?Imaging: Fluoroscopic guidance ?Anesthesia: Local anesthesia (1-2% Lidocaine) ?Anxiolysis: None                 ?Sedation: None. ?DOS: 10/12/2021  ?Performed by: Edward Jolly, MD ? ?Purpose: Diagnostic/Therapeutic ?Indications: Lumbar radicular pain severe enough to impact quality of life or function. ?1. Lumbar radiculopathy   ?2. Protrusion of lumbar intervertebral disc   ?3. Lumbosacral radiculopathy at L5 (right)   ? ?NAS-11 Pain score:  ? Pre-procedure: 4 /10  ? Post-procedure: 3 /10  ? ?  ?Position / Prep / Materials:  ?Position: Prone  ?Prep solution: DuraPrep (Iodine Povacrylex [0.7% available iodine] and Isopropyl Alcohol, 74% w/w) ?Prep Area: Entire Posterior Lumbosacral Area.  From the lower tip of the scapula down to the tailbone and from flank to flank. ?Materials:  ?Tray: Block ?Needle(s):  ?Type: Spinal  ?Gauge (G): 22  ?Length: 7.0-in  ?Qty: 1 ? ?Pre-op H&P Assessment:  ?Debra Morales is a 36 y.o. (year old), female patient, seen  today for interventional treatment. She  has a past surgical history that includes Laparoscopic gastric sleeve resection (2016). Debra Morales has a current medication list which includes the following prescription(s): albuterol, aripiprazole, ascorbic acid, excedrin migraine, azelastine, clonazepam, duloxetine, duloxetine, ferrous sulfate, gabapentin, norethindrone-ethinyl estradiol-fe, nortriptyline, omeprazole, prenatal, topiramate, amitriptyline, cholecalciferol, hydrochlorothiazide, hydrochlorothiazide, ondansetron, and valsartan. Her primarily concern today is the Back Pain (lower) ? ?Initial Vital Signs:  ?Pulse/HCG Rate: 91ECG Heart Rate: 92 ?Temp: 98.1 ?F (36.7 ?C) ?Resp: 16 ?BP:  ?(!) 142/90 ?SpO2: 99 % ? ?BMI: Estimated body mass index is 63.06 kg/m? as calculated from the following: ?  Height as of this encounter: 5\' 3"  (1.6 m). ?  Weight as of this encounter: 356 lb (161.5 kg). ? ?Risk Assessment: ?Allergies: Reviewed. She is allergic to measles and rubella virus vaccine and reglan [metoclopramide].  ?Allergy Precautions: None required ?Coagulopathies: Reviewed. None identified.  ?Blood-thinner therapy: None at this time ?Active Infection(s): Reviewed. None identified. Debra Morales is afebrile ? ?Site Confirmation: Debra Morales was asked to confirm the procedure and laterality before marking the site ?Procedure checklist: Completed ?Consent: Before the procedure and under the influence of no sedative(s), amnesic(s), or anxiolytics, the patient was informed of the treatment options, risks and possible complications. To fulfill our ethical and legal obligations, as recommended by the [employer] Association's Code of Ethics, I have informed the patient of my clinical impression; the nature and purpose of the treatment or procedure; the risks, benefits, and possible complications of the intervention; the alternatives, including doing nothing; the risk(s) and benefit(s) of the alternative treatment(s) or  procedure(s); and the risk(s) and benefit(s) of doing nothing. ?The patient was  provided information about the general risks and possible complications associated with the procedure. These may include, but are not limited to: failure to achieve desired goals, infection, bleeding, organ or nerve damage, allergic reactions, paralysis, and death. ?In addition, the patient was informed of those risks and complications associated to Spine-related procedures, such as failure to decrease pain; infection (i.e.: Meningitis, epidural or intraspinal abscess); bleeding (i.e.: epidural hematoma, subarachnoid hemorrhage, or any other type of intraspinal or peri-dural bleeding); organ or nerve damage (i.e.: Any type of peripheral nerve, nerve root, or spinal cord injury) with subsequent damage to sensory, motor, and/or autonomic systems, resulting in permanent pain, numbness, and/or weakness of one or several areas of the body; allergic reactions; (i.e.: anaphylactic reaction); and/or death. ?Furthermore, the patient was informed of those risks and complications associated with the medications. These include, but are not limited to: allergic reactions (i.e.: anaphylactic or anaphylactoid reaction(s)); adrenal axis suppression; blood sugar elevation that in diabetics may result in ketoacidosis or comma; water retention that in patients with history of congestive heart failure may result in shortness of breath, pulmonary edema, and decompensation with resultant heart failure; weight gain; swelling or edema; medication-induced neural toxicity; particulate matter embolism and blood vessel occlusion with resultant organ, and/or nervous system infarction; and/or aseptic necrosis of one or more joints. ?Finally, the patient was informed that Medicine is not an exact science; therefore, there is also the possibility of unforeseen or unpredictable risks and/or possible complications that may result in a catastrophic outcome. The patient  indicated having understood very clearly. We have given the patient no guarantees and we have made no promises. Enough time was given to the patient to ask questions, all of which were answered to the patient's satisfaction. Debra Morales has indicated that she wanted to continue with the procedure. ?Attestation: I, the ordering provider, attest that I have discussed with the patient the benefits, risks, side-effects, alternatives, likelihood of achieving goals, and potential problems during recovery for the procedure that I have provided informed consent. ?Date  Time: 10/12/2021  8:32 AM ? ?Pre-Procedure Preparation:  ?Monitoring: As per clinic protocol. Respiration, ETCO2, SpO2, BP, heart rate and rhythm monitor placed and checked for adequate function ?Safety Precautions: Patient was assessed for positional comfort and pressure points before starting the procedure. ?Time-out: I initiated and conducted the "Time-out" before starting the procedure, as per protocol. The patient was asked to participate by confirming the accuracy of the "Time Out" information. Verification of the correct person, site, and procedure were performed and confirmed by me, the nursing staff, and the patient. "Time-out" conducted as per Joint Commission's Universal Protocol (UP.01.01.01). ?Time: 5462 ? ?Description/Narrative of Procedure:          ?Target: The 6 o'clock position under the pedicle, on the affected side. ?Region: Posterolateral Lumbosacral ?Approach: Posterior Percutaneous Paravertebral approach. ? ?Rationale (medical necessity): procedure needed and proper for the diagnosis and/or treatment of the patient's medical symptoms and needs. ?Procedural Technique Safety Precautions: Aspiration looking for blood return was conducted prior to all injections. At no point did we inject any substances, as a needle was being advanced. No attempts were made at seeking any paresthesias. Safe injection practices and needle disposal techniques  used. Medications properly checked for expiration dates. SDV (single dose vial) medications used. ?Description of the Procedure: Protocol guidelines were followed. The patient was placed in position over

## 2021-10-12 NOTE — Patient Instructions (Signed)

## 2021-10-13 ENCOUNTER — Telehealth: Payer: Self-pay

## 2021-10-13 NOTE — Telephone Encounter (Signed)
Post procedure phone call. Patient states she is doing good.  

## 2021-11-10 ENCOUNTER — Ambulatory Visit
Payer: 59 | Attending: Student in an Organized Health Care Education/Training Program | Admitting: Student in an Organized Health Care Education/Training Program

## 2021-11-10 ENCOUNTER — Encounter: Payer: Self-pay | Admitting: Student in an Organized Health Care Education/Training Program

## 2021-11-10 DIAGNOSIS — M5126 Other intervertebral disc displacement, lumbar region: Secondary | ICD-10-CM | POA: Diagnosis not present

## 2021-11-10 DIAGNOSIS — M5416 Radiculopathy, lumbar region: Secondary | ICD-10-CM

## 2021-11-10 DIAGNOSIS — M5417 Radiculopathy, lumbosacral region: Secondary | ICD-10-CM

## 2021-11-10 NOTE — Assessment & Plan Note (Signed)
Positive response to initial right L5 transforaminal ESI.  Patient is noticing return of pain.  Recommend repeating right L5 transforaminal ESI and adding additional volume. ?

## 2021-11-10 NOTE — Progress Notes (Signed)
Patient: Debra Morales  Service Category: E/M  Provider: Gillis Santa, MD  ?DOB: [birth year]  DOS: 11/10/2021  Location: Office  ?MRN: [id number]  Setting: Ambulatory outpatient  Referring Provider: Gayland Curry, MD  ?Type: Established Patient  Specialty: Interventional Pain Management  PCP: Gayland Curry, MD  ?Location: Remote location  Delivery: TeleHealth    ? ?Virtual Encounter - Pain Management ?PROVIDER NOTE: Information contained herein reflects review and annotations entered in association with encounter. Interpretation of such information and data should be left to medically-trained personnel. Information provided to patient can be located elsewhere in the medical record under "Patient Instructions". Document created using STT-dictation technology, any transcriptional errors that may result from process are unintentional.  ?  ?Contact & Pharmacy ?Preferred: [phone] ?Home: [phone] (home) ?Mobile: [phone] (mobile) ?E-mail: [email or web address]  ?[facility] #[id number]$- [place] [facility] [address] ?[address] ?[facility]?Phone: [phone]: [phone]?  ?Pre-screening  ?Ms. [phone] "in-person" vs "virtual" encounter. She indicated preferring virtual for this encounter.  ? ?Reason ?COVID-19*  Social distancing based on CDC and AMA recommendations.  ? ?I contacted KShela Leffon 11/10/2021 via telephone.      I clearly identified myself as B18/03/2022 MD. I verified that I was speaking with the correct person using two identifiers (Name: KAleigha Gilani and date of birth: [birth year]. ? ?Consent ?I sought verbal advanced consent from K20/7/1987for virtual visit interactions. I informed Ms. KGaffeyof possible security and privacy concerns, risks, and limitations associated with providing "not-in-person" medical evaluation and management services. I also informed Ms. KSterbaof the availability of "in-person" appointments.  Finally, I informed her that there would be a charge for the virtual visit and that she could be  personally, fully or partially, financially responsible for it. Ms. KHuizarexpressed understanding and agreed to proceed.  ? ?Historic Elements   ?Ms. KJalissaMWinry Egnewis a 36y.o. year old, female patient evaluated today after our last contact on 10/12/2021. Ms. KBoss has a past medical history of Asthma, Depression, Iron deficiency anemia, Sleep apnea, and Vitamin D deficiency. She also  has a past surgical history that includes Laparoscopic gastric sleeve resection (2016). Ms. KArvanitishas a current medication list which includes the following prescription(s): albuterol, aripiprazole, ascorbic acid, excedrin migraine, azelastine, clonazepam, duloxetine, duloxetine, ferrous sulfate, gabapentin, hydrochlorothiazide, norethindrone-ethinyl estradiol-fe, nortriptyline, omeprazole, prenatal, topiramate, amitriptyline, cholecalciferol, hydrochlorothiazide, ondansetron, and valsartan. She  reports that she has never smoked. She has never used smokeless tobacco. She reports that she does not drink alcohol and does not use drugs. Ms. KHittis allergic to measles and rubella virus vaccine and reglan [metoclopramide].  ? ?HPI  ?Today, she is being contacted for a post-procedure assessment. ? ? ?Post-procedure evaluation  ? Type: Trans-Foraminal Epidural Steroid Injection (Lumbar) #1  ?Laterality: Right  ?Level: L5           ?Imaging: Fluoroscopic guidance ?Anesthesia: Local anesthesia (1-2% Lidocaine) ?Anxiolysis: None                 ?Sedation: None. ?DOS: 10/12/2021  ?Performed by: B17/04/2022 MD ? ?Purpose: Diagnostic/Therapeutic ?Indications: Lumbar radicular pain severe enough to impact quality of life or function. ?1. Lumbar radiculopathy   ?2. Protrusion of lumbar intervertebral disc   ?3. Lumbosacral radiculopathy at L5 (right)   ? ?NAS-11 Pain score:  ? Pre-procedure: 4 /10  ? Post-procedure: 3 /10   ? ?Effectiveness:  ?Initial hour after procedure: 40 %  ?  Subsequent 4-6 hours post-procedure: 40 %  ?Analgesia past initial 6 hours: 40 % (did experience pain relief.  was able to tolerate activity a little better.  the pain now is beginning to come back.)  ?Ongoing improvement:  ?Analgesic:  40%, however patient feels that pain is returning.  She would like to repeat injection. ?Function: Ms. Larsen reports improvement in function ?ROM: Ms. Kluesner reports improvement in ROM ? ? ?Laboratory Chemistry Profile  ? ?Renal ?Lab Results  ?Component Value Date  ? BUN 11 05/02/2013  ? CREATININE 0.80 05/02/2013  ? GFRAA >60 05/02/2013  ? GFRNONAA >60 05/02/2013  ?  Hepatic ?Lab Results  ?Component Value Date  ? AST 16 05/02/2013  ? ALT 20 05/02/2013  ? ALBUMIN 3.4 05/02/2013  ? ALKPHOS 87 05/02/2013  ?  ?Electrolytes ?Lab Results  ?Component Value Date  ? NA 136 05/02/2013  ? K 3.6 05/02/2013  ? CL 106 05/02/2013  ? CALCIUM 9.1 05/02/2013  ? MG 1.8 09/13/2012  ?  Bone ?No results found for: [place], [id number], [id number], [id number], 25OHVITD1, 25OHVITD2, 25OHVITD3, TESTOFREE, TESTOSTERONE  ?Inflammation (CRP: Acute Phase) (ESR: Chronic Phase) ?No results found for: CRP, ESRSEDRATE, LATICACIDVEN    ?  ? ?Note: Above Lab results reviewed. ? ?Imaging  ?[facility] C-ARM 1-60 MIN NO REPORT ?Fluoro was used, but no Radiologist interpretation will be provided.  ?Please refer to "NOTES" tab for provider progress note. ? ?Assessment  ?The primary encounter diagnosis was Lumbar radiculopathy. Diagnoses of Protrusion of lumbar intervertebral disc and Lumbosacral radiculopathy at L5 (right) were also pertinent to this visit. ? ?Plan of Care  ?Problem-specific:  ?Lumbosacral radiculopathy at L5 (right) ?Positive response to initial right L5 transforaminal ESI.  Patient is noticing return of pain.  Recommend repeating right L5 transforaminal ESI and adding additional volume. ? ?Ms. Suriya Kovarik has a current medication  list which includes the following long-term medication(s): albuterol, aripiprazole, azelastine, clonazepam, duloxetine, duloxetine, ferrous sulfate, gabapentin, hydrochlorothiazide, norethindrone-ethinyl estradiol-fe, nortriptyline, omeprazole, topiramate, amitriptyline, hydrochlorothiazide, and valsartan. ? ? ? ?Orders:  ?Orders Placed This Encounter  ?Procedures  ? Lumbar Transforaminal Epidural  ?  Standing Status:   Future  ?  Standing Expiration Date:   12/11/2021  ?  Scheduling Instructions:  ?   Right L5 without sedation  ?  Order Specific Question:   Where will this procedure be performed?  ?  Answer:   [facility]  ? ?Follow-up plan:   ?Return in about 8 days (around 11/18/2021) for R L5 TF ESI (7 inch) #2, in clinic NS.   ?  ?R L5TFESI 10/12/21 ?  ?Recent Visits ?Date Type Provider Dept  ?10/12/21 Procedure visit Gillis Santa, MD [facility]  ?09/30/21 Office Visit Gillis Santa, MD [facility]  ?Showing recent visits within past 90 days and meeting all other requirements ?Today's Visits ?Date Type Provider Dept  ?11/10/21 Office Visit Gillis Santa, MD [facility]  ?Showing today's visits and meeting all other requirements ?Future Appointments ?No visits were found meeting these conditions. ?Showing future appointments within next 90 days and meeting all other requirements ? ?I discussed the assessment and treatment plan with the patient. The patient was provided an opportunity to ask questions and all were answered. The patient agreed with the plan and demonstrated an understanding of the instructions. ? ?Patient advised to call back or seek an in-person evaluation if the symptoms or condition worsens. ? ?Duration of encounter: 78mnutes. ? ?Note by: BGillis Santa MD ?Date:  11/10/2021; Time: 2:26 PM ?

## 2021-11-11 NOTE — Patient Instructions (Signed)
______________________________________________________________________  Preparing for your procedure (without sedation)  Procedure appointments are limited to planned procedures: No Prescription Refills. No disability issues will be discussed. No medication changes will be discussed.  Instructions: Food Intake: Avoid eating anything for at least 4 hours prior to your procedure. Transportation: Unless otherwise stated by your physician, bring a driver. Morning Medicines: Take all of your scheduled morning medications. If you take heart medicine, except for blood thinners, do not forget to take it the morning of the procedure. If your Diastolic (lower reading) is above 100 mmHg, elective cases will be cancelled/rescheduled. Blood thinners: These will need to be stopped for procedures. Notify our staff if you are taking any blood thinners. Depending on which one you take, there will be specific instructions on how and when to stop it. Diabetics on insulin: Notify the staff so that you can be scheduled 1st case in the morning. If your diabetes requires high dose insulin, take only  of your normal insulin dose the morning of the procedure and notify the staff that you have done so. Preventing infections: Shower with an antibacterial soap the morning of your procedure.  Build-up your immune system: Take 1000 mg of Vitamin C with every meal (3 times a day) the day prior to your procedure. Antibiotics: Inform the staff if you have a condition or reason that requires you to take antibiotics before dental procedures. Pregnancy: If you are pregnant, call and cancel the procedure. Sickness: If you have a cold, fever, or any active infections, call and cancel the procedure. Arrival: You must be in the facility at least 30 minutes prior to your scheduled procedure. Children: Do not bring any children with you. Dress appropriately: There is always a possibility that your clothing may get soiled. Valuables:  Do not bring any jewelry or valuables.  Reasons to call and reschedule or cancel your procedure: (Following these recommendations will minimize the risk of a serious complication.) Surgeries: Avoid having procedures within 2 weeks of any surgery. (Avoid for 2 weeks before or after any surgery). Flu Shots: Avoid having procedures within 2 weeks of a flu shots or . (Avoid for 2 weeks before or after immunizations). Barium: Avoid having a procedure within 7-10 days after having had a radiological study involving the use of radiological contrast. (Myelograms, Barium swallow or enema study). Heart attacks: Avoid any elective procedures or surgeries for the initial 6 months after a "Myocardial Infarction" (Heart Attack). Blood thinners: It is imperative that you stop these medications before procedures. Let us know if you if you take any blood thinner.  Infection: Avoid procedures during or within two weeks of an infection (including chest colds or gastrointestinal problems). Symptoms associated with infections include: Localized redness, fever, chills, night sweats or profuse sweating, burning sensation when voiding, cough, congestion, stuffiness, runny nose, sore throat, diarrhea, nausea, vomiting, cold or Flu symptoms, recent or current infections. It is specially important if the infection is over the area that we intend to treat. Heart and lung problems: Symptoms that may suggest an active cardiopulmonary problem include: cough, chest pain, breathing difficulties or shortness of breath, dizziness, ankle swelling, uncontrolled high or unusually low blood pressure, and/or palpitations. If you are experiencing any of these symptoms, cancel your procedure and contact your primary care physician for an evaluation.  Remember:  Regular Business hours are:  Monday to Thursday 8:00 AM to 4:00 PM  Provider's Schedule: Francisco Naveira, MD:  Procedure days: Tuesday and Thursday 7:30 AM to 4:00 PM    Bilal  Lateef, MD:  Procedure days: Monday and Wednesday 7:30 AM to 4:00 PM ______________________________________________________________________   

## 2021-11-18 ENCOUNTER — Ambulatory Visit (HOSPITAL_BASED_OUTPATIENT_CLINIC_OR_DEPARTMENT_OTHER): Payer: 59 | Admitting: Student in an Organized Health Care Education/Training Program

## 2021-11-18 ENCOUNTER — Encounter: Payer: Self-pay | Admitting: Student in an Organized Health Care Education/Training Program

## 2021-11-18 ENCOUNTER — Ambulatory Visit
Admission: RE | Admit: 2021-11-18 | Discharge: 2021-11-18 | Disposition: A | Discharge status: Home / Self Care | Payer: 59 | Source: Ambulatory Visit | Attending: Student in an Organized Health Care Education/Training Program | Admitting: Student in an Organized Health Care Education/Training Program

## 2021-11-18 VITALS — BP 120/76 | HR 94 | Temp 97.3°F | Resp 18 | Ht 63.0 in | Wt 356.0 lb

## 2021-11-18 DIAGNOSIS — M5126 Other intervertebral disc displacement, lumbar region: Secondary | ICD-10-CM

## 2021-11-18 DIAGNOSIS — M5116 Intervertebral disc disorders with radiculopathy, lumbar region: Secondary | ICD-10-CM | POA: Insufficient documentation

## 2021-11-18 DIAGNOSIS — Z01810 Encounter for preprocedural cardiovascular examination: Secondary | ICD-10-CM | POA: Insufficient documentation

## 2021-11-18 DIAGNOSIS — M5416 Radiculopathy, lumbar region: Secondary | ICD-10-CM

## 2021-11-18 DIAGNOSIS — M5417 Radiculopathy, lumbosacral region: Secondary | ICD-10-CM

## 2021-11-18 MED ORDER — DEXAMETHASONE SODIUM PHOSPHATE 10 MG/ML IJ SOLN
10.0000 mg | Freq: Once | INTRAMUSCULAR | Status: AC
  Administered 2021-11-18: 10 mg
  Filled 2021-11-18: qty 1

## 2021-11-18 MED ORDER — IOHEXOL 180 MG/ML  SOLN
10.0000 mL | Freq: Once | INTRAMUSCULAR | Status: AC
  Administered 2021-11-18: 10 mL via EPIDURAL
  Filled 2021-11-18: qty 20

## 2021-11-18 MED ORDER — LIDOCAINE HCL 2 % IJ SOLN
20.0000 mL | Freq: Once | INTRAMUSCULAR | Status: AC
  Administered 2021-11-18: 100 mg
  Filled 2021-11-18: qty 40

## 2021-11-18 MED ORDER — ROPIVACAINE HCL 2 MG/ML IJ SOLN
1.0000 mL | Freq: Once | INTRAMUSCULAR | Status: AC
  Administered 2021-11-18: 1 mL via EPIDURAL
  Filled 2021-11-18: qty 20

## 2021-11-18 NOTE — Patient Instructions (Signed)
____________________________________________________________________________________________ ? ?Post-Procedure Discharge Instructions ? ?Instructions: ?Apply ice:  ?Purpose: This will minimize any swelling and discomfort after procedure.  ?When: Day of procedure, as soon as you get home. ?How: Fill a plastic sandwich bag with crushed ice. Cover it with a small towel and apply to injection site. ?How long: (15 min on, 15 min off) Apply for 15 minutes then remove x 15 minutes.  Repeat sequence on day of procedure, until you go to bed. ?Apply heat:  ?Purpose: To treat any soreness and discomfort from the procedure. ?When: Starting the next day after the procedure. ?How: Apply heat to procedure site starting the day following the procedure. ?How long: May continue to repeat daily, until discomfort goes away. ?Food intake: Start with clear liquids (like water) and advance to regular food, as tolerated.  ?Physical activities: Keep activities to a minimum for the first 8 hours after the procedure. After that, then as tolerated. ?Driving: If you have received any sedation, be responsible and do not drive. You are not allowed to drive for 24 hours after having sedation. ?Blood thinner: (Applies only to those taking blood thinners) You may restart your blood thinner 6 hours after your procedure. ?Insulin: (Applies only to Diabetic patients taking insulin) As soon as you can eat, you may resume your normal dosing schedule. ?Infection prevention: Keep procedure site clean and dry. Shower daily and clean area with soap and water. ?Post-procedure Pain Diary: Extremely important that this be done correctly and accurately. Recorded information will be used to determine the next step in treatment. For the purpose of accuracy, follow these rules: ?Evaluate only the area treated. Do not report or include pain from an untreated area. For the purpose of this evaluation, ignore all other areas of pain, except for the treated area. ?After  your procedure, avoid taking a long nap and attempting to complete the pain diary after you wake up. Instead, set your alarm clock to go off every hour, on the hour, for the initial 8 hours after the procedure. Document the duration of the numbing medicine, and the relief you are getting from it. ?Do not go to sleep and attempt to complete it later. It will not be accurate. If you received sedation, it is likely that you were given a medication that may cause amnesia. Because of this, completing the diary at a later time may cause the information to be inaccurate. This information is needed to plan your care. ?Follow-up appointment: Keep your post-procedure follow-up evaluation appointment after the procedure (usually 2 weeks for most procedures, 6 weeks for radiofrequencies). DO NOT FORGET to bring you pain diary with you.  ? ?Expect: (What should I expect to see with my procedure?) ?From numbing medicine (AKA: Local Anesthetics): Numbness or decrease in pain. You may also experience some weakness, which if present, could last for the duration of the local anesthetic. ?Onset: Full effect within 15 minutes of injected. ?Duration: It will depend on the type of local anesthetic used. On the average, 1 to 8 hours.  ?From steroids (Applies only if steroids were used): Decrease in swelling or inflammation. Once inflammation is improved, relief of the pain will follow. ?Onset of benefits: Depends on the amount of swelling present. The more swelling, the longer it will take for the benefits to be seen. In some cases, up to 10 days. ?Duration: Steroids will stay in the system x 2 weeks. Duration of benefits will depend on multiple posibilities including persistent irritating factors. ?Side-effects: If present, they   may typically last 2 weeks (the duration of the steroids). ?Frequent: Cramps (if they occur, drink Gatorade and take over-the-counter Magnesium 450-500 mg once to twice a day); water retention with temporary  weight gain; increases in blood sugar; decreased immune system response; increased appetite. ?Occasional: Facial flushing (red, warm cheeks); mood swings; menstrual changes. ?Uncommon: Long-term decrease or suppression of natural hormones; bone thinning. (These are more common with higher doses or more frequent use. This is why we prefer that our patients avoid having any injection therapies in other practices.)  ?Very Rare: Severe mood changes; psychosis; aseptic necrosis. ?From procedure: Some discomfort is to be expected once the numbing medicine wears off. This should be minimal if ice and heat are applied as instructed. ? ?Call if: (When should I call?) ?You experience numbness and weakness that gets worse with time, as opposed to wearing off. ?New onset bowel or bladder incontinence. (Applies only to procedures done in the spine) ? ?Emergency Numbers: ?Durning business hours (Monday - Thursday, 8:00 AM - 4:00 PM) (Friday, 9:00 AM - 12:00 Noon): [phone] ?After hours: [phone] ?NOTE: If you are having a problem and are unable connect with, or to talk to a provider, then go to your nearest urgent care or emergency department. If the problem is serious and urgent, please call 911. ?____________________________________________________________________________________________ ? Selective Nerve Root Block ?Patient Information ? ?Description: Specific nerve roots exit the spinal canal and these nerves can be compressed and inflamed by a bulging disc and bone spurs.  By injecting steroids on the nerve root, we can potentially decrease the inflammation surrounding these nerves, which often leads to decreased pain.  Also, by injecting local anesthesia on the nerve root, this can provide us helpful information to give to your referring doctor if it decreases your pain.  Selective nerve root blocks can be done along the spine from the neck to the low back depending on the location of your pain. ?  After numbing  the skin with local anesthesia, a small needle is passed to the nerve root and the position of the needle is verified using x-ray pictures.  After the needle is in correct position, we then deposit the medication.  You may experience a pressure sensation while this is being done.  The entire block usually lasts less than 15 minutes. ? ?Conditions that may be treated with selective nerve root blocks: ?Low back and leg pain ?Spinal stenosis ?Diagnostic block prior to potential surgery ?Neck and arm pain ?Post laminectomy syndrome ? ?Preparation for the injection: ? ?Do not eat any solid food or dairy products within 8 hours of your appointment. ?You may drink clear liquids up to 3 hours before an appointment.  Clear liquids include water, black coffee, juice or soda.  No milk or cream please. ?You may take your regular medications, including pain medications, with a sip of water before your appointment.  Diabetics should hold regular insulin (if taken separately) and take 1/2 normal NPH dose the morning of the procedure.  Carry some sugar containing items with you to your appointment. ?A driver must accompany you and be prepared to drive you home after your procedure. ?Bring all your current medications with you. ?An IV may be inserted and sedation may be given at the discretion of the physician. ?A blood pressure cuff, EKG, and other monitors will often be applied during the procedure.  Some patients may need to have extra oxygen administered for a short period. ?You will be asked to provide   medical information, including allergies, prior to the procedure.  We must know immediately if you are taking blood ? ?Thinners ?(like Coumadin) or if you are allergic to IV iodine contrast (dye). ? ?Possible side-effects: ?All are usually temporary ?Bleeding from needle site ?Light headedness ?Numbness and tingling ?Decreased blood pressure ?Weakness in arms/legs ?Pressure sensation in back/neck ?Pain at injection site (several  days) ? ?Possible complications: ?All are extremely rare ?Infection ?Nerve injury ?Spinal headache (a headache wore with upright position) ? ?Call if you experience: ?Fever/chills associated with headac

## 2021-11-18 NOTE — Progress Notes (Signed)
Safety precautions to be maintained throughout the outpatient stay will include: orient to surroundings, keep bed in low position, maintain call bell within reach at all times, provide assistance with transfer out of bed and ambulation.  

## 2021-11-18 NOTE — Progress Notes (Signed)
PROVIDER NOTE: Interpretation of information contained herein should be left to medically-trained personnel. Specific patient instructions are provided elsewhere under "Patient Instructions" section of medical record. This document was created in part using STT-dictation technology, any transcriptional errors that may result from this process are unintentional.  ?Patient: Debra Morales ?Type: Established ?DOB: 13-Oct-1985 ?MRN: [id number] ?PCP: Leim Fabry, MD  Service: Procedure ?DOS: 11/18/2021 ?Setting: Ambulatory ?Location: Ambulatory outpatient facility ?Delivery: Face-to-face Provider: Edward Jolly, MD ?Specialty: Interventional Pain Management ?Specialty designation: 09 ?Location: Outpatient facility ?Ref. Prov.: Leim Fabry, MD   ? ?Primary Reason for Visit: Interventional Pain Management Treatment. ?CC: Back Pain (low) ?  ?Procedure:          ? Type: Trans-Foraminal Epidural Steroid Injection (Lumbar) #2  ?Laterality: Right  ?Level: L5           ?Imaging: Fluoroscopic guidance ?Anesthesia: Local anesthesia (1-2% Lidocaine) ?Anxiolysis: None                 ?Sedation: None. ?DOS: 11/18/2021  ?Performed by: Edward Jolly, MD ? ?Purpose: Diagnostic/Therapeutic ?Indications: Lumbar radicular pain severe enough to impact quality of life or function. ?1. Lumbar radiculopathy   ?2. Protrusion of lumbar intervertebral disc   ?3. Lumbosacral radiculopathy at L5 (right)   ? ? ?NAS-11 Pain score:  ? Pre-procedure: 5 /10  ? Post-procedure: 3 /10  ? ?  ?Position / Prep / Materials:  ?Position: Prone  ?Prep solution: DuraPrep (Iodine Povacrylex [0.7% available iodine] and Isopropyl Alcohol, 74% w/w) ?Prep Area: Entire Posterior Lumbosacral Area.  From the lower tip of the scapula down to the tailbone and from flank to flank. ?Materials:  ?Tray: Block ?Needle(s):  ?Type: Spinal  ?Gauge (G): 22  ?Length: 7.0-in  ?Qty: 1 ? ?Pre-op H&P Assessment:  ?Debra Morales is a 36 y.o. (year old), female patient, seen  today for interventional treatment. She  has a past surgical history that includes Laparoscopic gastric sleeve resection (2016). Debra Morales has a current medication list which includes the following prescription(s): albuterol, aripiprazole, ascorbic acid, excedrin migraine, azelastine, clonazepam, duloxetine, ferrous sulfate, gabapentin, hydrochlorothiazide, norethindrone-ethinyl estradiol-fe, nortriptyline, omeprazole, prenatal, topiramate, valsartan, amitriptyline, cholecalciferol, duloxetine, hydrochlorothiazide, and ondansetron. Her primarily concern today is the Back Pain (low) ? ?Initial Vital Signs:  ?Pulse/HCG Rate: 94ECG Heart Rate: 90 ?Temp: (!) 97.3 ?F (36.3 ?C) ?Resp: 16 ?BP:  ?136/88 ?SpO2: 97 % ? ?BMI: Estimated body mass index is 63.06 kg/m? as calculated from the following: ?  Height as of this encounter: 5\' 3"  (1.6 m). ?  Weight as of this encounter: 356 lb (161.5 kg). ? ?Risk Assessment: ?Allergies: Reviewed. She is allergic to measles and rubella virus vaccine and reglan [metoclopramide].  ?Allergy Precautions: None required ?Coagulopathies: Reviewed. None identified.  ?Blood-thinner therapy: None at this time ?Active Infection(s): Reviewed. None identified. Debra Morales is afebrile ? ?Site Confirmation: Debra Morales was asked to confirm the procedure and laterality before marking the site ?Procedure checklist: Completed ?Consent: Before the procedure and under the influence of no sedative(s), amnesic(s), or anxiolytics, the patient was informed of the treatment options, risks and possible complications. To fulfill our ethical and legal obligations, as recommended by the American Medical Association's Code of Ethics, I have informed the patient of my clinical impression; the nature and purpose of the treatment or procedure; the risks, benefits, and possible complications of the intervention; the alternatives, including doing nothing; the risk(s) and benefit(s) of the alternative treatment(s) or  procedure(s); and the risk(s) and benefit(s) of doing nothing. ?The patient  was provided information about the general risks and possible complications associated with the procedure. These may include, but are not limited to: failure to achieve desired goals, infection, bleeding, organ or nerve damage, allergic reactions, paralysis, and death. ?In addition, the patient was informed of those risks and complications associated to Spine-related procedures, such as failure to decrease pain; infection (i.e.: Meningitis, epidural or intraspinal abscess); bleeding (i.e.: epidural hematoma, subarachnoid hemorrhage, or any other type of intraspinal or peri-dural bleeding); organ or nerve damage (i.e.: Any type of peripheral nerve, nerve root, or spinal cord injury) with subsequent damage to sensory, motor, and/or autonomic systems, resulting in permanent pain, numbness, and/or weakness of one or several areas of the body; allergic reactions; (i.e.: anaphylactic reaction); and/or death. ?Furthermore, the patient was informed of those risks and complications associated with the medications. These include, but are not limited to: allergic reactions (i.e.: anaphylactic or anaphylactoid reaction(s)); adrenal axis suppression; blood sugar elevation that in diabetics may result in ketoacidosis or comma; water retention that in patients with history of congestive heart failure may result in shortness of breath, pulmonary edema, and decompensation with resultant heart failure; weight gain; swelling or edema; medication-induced neural toxicity; particulate matter embolism and blood vessel occlusion with resultant organ, and/or nervous system infarction; and/or aseptic necrosis of one or more joints. ?Finally, the patient was informed that Medicine is not an exact science; therefore, there is also the possibility of unforeseen or unpredictable risks and/or possible complications that may result in a catastrophic outcome. The patient  indicated having understood very clearly. We have given the patient no guarantees and we have made no promises. Enough time was given to the patient to ask questions, all of which were answered to the patient's satisfaction. Ms. Millis has indicated that she wanted to continue with the procedure. ?Attestation: I, the ordering provider, attest that I have discussed with the patient the benefits, risks, side-effects, alternatives, likelihood of achieving goals, and potential problems during recovery for the procedure that I have provided informed consent. ?Date  Time: 11/18/2021  9:00 AM ? ?Pre-Procedure Preparation:  ?Monitoring: As per clinic protocol. Respiration, ETCO2, SpO2, BP, heart rate and rhythm monitor placed and checked for adequate function ?Safety Precautions: Patient was assessed for positional comfort and pressure points before starting the procedure. ?Time-out: I initiated and conducted the "Time-out" before starting the procedure, as per protocol. The patient was asked to participate by confirming the accuracy of the "Time Out" information. Verification of the correct person, site, and procedure were performed and confirmed by me, the nursing staff, and the patient. "Time-out" conducted as per Joint Commission's Universal Protocol (UP.01.01.01). ?Time: 1000 ? ?Description/Narrative of Procedure:          ?Target: The 6 o'clock position under the pedicle, on the affected side. ?Region: Posterolateral Lumbosacral ?Approach: Posterior Percutaneous Paravertebral approach. ? ?Rationale (medical necessity): procedure needed and proper for the diagnosis and/or treatment of the patient's medical symptoms and needs. ?Procedural Technique Safety Precautions: Aspiration looking for blood return was conducted prior to all injections. At no point did we inject any substances, as a needle was being advanced. No attempts were made at seeking any paresthesias. Safe injection practices and needle disposal techniques  used. Medications properly checked for expiration dates. SDV (single dose vial) medications used. ?Description of the Procedure: Protocol guidelines were followed. The patient was placed in position over th

## 2021-11-19 ENCOUNTER — Telehealth: Payer: Self-pay | Admitting: *Deleted

## 2021-11-19 NOTE — Telephone Encounter (Signed)
Called for procedure check. States doing well and denies any issues this AM.

## 2021-11-23 ENCOUNTER — Encounter: Payer: 59 | Attending: Physical Medicine & Rehabilitation | Admitting: Dietician

## 2021-11-23 VITALS — Ht 64.0 in | Wt 359.0 lb

## 2021-11-23 DIAGNOSIS — Z6841 Body Mass Index (BMI) 40.0 and over, adult: Secondary | ICD-10-CM | POA: Diagnosis not present

## 2021-11-23 DIAGNOSIS — Z713 Dietary counseling and surveillance: Secondary | ICD-10-CM | POA: Insufficient documentation

## 2021-11-23 DIAGNOSIS — I10 Essential (primary) hypertension: Secondary | ICD-10-CM

## 2021-11-23 DIAGNOSIS — M5126 Other intervertebral disc displacement, lumbar region: Secondary | ICD-10-CM

## 2021-11-23 NOTE — Progress Notes (Signed)
Medical Nutrition Therapy: Visit start time: 1600  end time: 1715  Assessment:  Diagnosis: obesity Past medical history: HTN, GERD, sleep apnea, iron deficiency anemia, asthma, migraines Psychosocial issues/ stress concerns: anxiety, depression  Preferred learning method:  Auditory Visual Hands-on   Current weight: 359.0lbs Height: 5'4" BMI: 61.62  Medications, supplements: reconciled list in medical record  Progress and evaluation:  Patient reports trying multiple diets in the past with limited success; Atkins did not work well at all, led to constipation and was also costly She had sleeve gastrectomy 2014, which did not work. Lost 40lbs while on liquid diet, regained when eating soft foods, and gained more when transitioning to solid foods. She reports some significant life change recently; she has temporarily moved in with her mother until her home can be built.  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 Dining out frequency: 0-2 meals per week.  Breakfast: 2-3 protein bars; oatmeal instant 2-3 packs; considering restarting protein shake Snack: none Lunch: sometimes skips; often leftovers; occ ramen Snack: nuts; cottage cheese; pretzel filled with PB; was eating carrots/ veg packs; does like sweets ie ice cream; snack cakes ie zebra cakes Supper: spaghetti/ taco casserole made with tots, cheese, sour cream; homemade chili on noodles; chicken noodle soup with tri-color pasta; pizza from box kit; hot dogs; kielbasa beef + 2 veg ie green beans and corn/ occ peas and carrots/ occ candied carrots; flat steak baked with veg; roast beef with potatoes, green beans, peas/ frozen sweet potatoes, tomato stew; bbq chicken in crock pot with pot salad; jerk chicken with pineapple and cherries  over rice; salad with grilled chicken, cheese, and ranch; [employer]: none Beverages: water 4-5 glasses daily; crystal light; has decreased tea  Intervention:   Nutrition Care Education:   Basic nutrition: basic food groups; appropriate nutrient balance; appropriate meal and snack schedule; general nutrition guidelines    Weight control: determining reasonable weight loss rate, importance of low sugar and low fat choices, portion control strategies including increasing low-carb vegetables to stretch portions of starchy foods; estimated energy needs for weight loss at 1400 kcal, provided guidance for 45% CHO, 25% pro, 30% fat Advanced nutrition:  cooking techniques, ie overnight oats, ways to add vegetables blended with other foods    Nutritional Diagnosis:  [place]-3.3 Overweight/obesity As related to history of obesity, history of bariatric surgery, inactivity due to back pain.  As evidenced by patient with current BMI of 61.62.   Education Materials given:  Museum/gallery conservator with food lists, sample meal pattern Sample menus 1200-1500kcal bariatric menus Visit summary with goals/ instructions to be viewed via MyChart   Learner/ who was taught:  Patient  Spouse/ partner   Level of understanding: Verbalizes/ demonstrates competency   Demonstrated degree of understanding via:   Teach back Learning barriers: None   Willingness to learn/ readiness for change: Eager, change in progress  Monitoring and Evaluation:  Dietary intake, exercise, and body weight      follow up:  12/24/21 at 3:45pm

## 2021-11-23 NOTE — Patient Instructions (Signed)
Keep food choices low in fat and sugar; ideally <10grams per serving when checking food label.  Control portions of carbs/ starches while including generous portions of low carb veggies.  Plan to have something to eat every 3-5 hours during the day. Eating more calories earlier in the day will likely help control hunger better in the evening. Great job limiting tea and drinking water, keep it up! Include light activity when possible

## 2021-12-15 ENCOUNTER — Encounter: Payer: Self-pay | Admitting: Student in an Organized Health Care Education/Training Program

## 2021-12-15 ENCOUNTER — Ambulatory Visit
Payer: 59 | Attending: Student in an Organized Health Care Education/Training Program | Admitting: Student in an Organized Health Care Education/Training Program

## 2021-12-15 DIAGNOSIS — M5126 Other intervertebral disc displacement, lumbar region: Secondary | ICD-10-CM

## 2021-12-15 DIAGNOSIS — M5416 Radiculopathy, lumbar region: Secondary | ICD-10-CM | POA: Diagnosis not present

## 2021-12-15 DIAGNOSIS — M5417 Radiculopathy, lumbosacral region: Secondary | ICD-10-CM

## 2021-12-15 NOTE — Progress Notes (Signed)
Patient: Debra Morales  Service Category: E/M  Provider: Gillis Santa, MD  DOB: Apr 28, 1986  DOS: 12/15/2021  Location: Office  MRN: [id number]  Setting: Ambulatory outpatient  Referring Provider: Gayland Curry, MD  Type: Established Patient  Specialty: Interventional Pain Management  PCP: Gayland Curry, MD  Location: Remote location  Delivery: TeleHealth     Virtual Encounter - Pain Management PROVIDER NOTE: Information contained herein reflects review and annotations entered in association with encounter. Interpretation of such information and data should be left to medically-trained personnel. Information provided to patient can be located elsewhere in the medical record under "Patient Instructions". Document created using STT-dictation technology, any transcriptional errors that may result from process are unintentional.    Contact & Pharmacy Preferred: [phone] Home: [phone] (home) Mobile: [phone] (mobile) E-mail: [email or web address]  [id number] [place] - [address] [id number]: [phone]: [phone]  Pre-screening  Ms. KZauggoffered "in-person" vs "virtual" encounter. She indicated preferring virtual for this encounter.   Reason COVID-19*  Social distancing based on CDC and AMA recommendations.   I contacted KDaniel Noneson 12/15/2021 via telephone.      I clearly identified myself as BGillis Santa MD. I verified that I was speaking with the correct person using two identifiers (Name: KJanean Eischen and date of birth: 7Nov 04, 1987.  Consent I sought verbal advanced consent from KDaniel Nonesfor virtual visit interactions. I informed Ms. KDrumwrightof possible security and privacy concerns, risks, and limitations associated with providing "not-in-person" medical evaluation and management services. I also informed Ms. KHietalaof the availability of "in-person" appointments.  Finally, I informed her that there would be a charge for the virtual visit and that she could be  personally, fully or partially, financially responsible for it. Ms. KPerfectoexpressed understanding and agreed to proceed.   Historic Elements   Ms. KJenissa Tyrellis a 36y.o. year old, female patient evaluated today after our last contact on 11/18/2021. Ms. KSylvan has a past medical history of Asthma, Depression, Iron deficiency anemia, Sleep apnea, and Vitamin D deficiency. She also  has a past surgical history that includes Laparoscopic gastric sleeve resection (2016). Ms. KSpurlinghas a current medication list which includes the following prescription(s): acetaminophen, albuterol, amitriptyline, aripiprazole, ascorbic acid, excedrin migraine, azelastine, cholecalciferol, clonazepam, duloxetine, ferrous sulfate, gabapentin, emgality, ibuprofen, norethindrone-ethinyl estradiol-fe, nortriptyline, nortriptyline, omeprazole, prenatal, topiramate, duloxetine, hydrochlorothiazide, hydrochlorothiazide, ondansetron, and valsartan. She  reports that she has never smoked. She has never used smokeless tobacco. She reports that she does not drink alcohol and does not use drugs. Ms. KBrophyis allergic to covid-19 (mrna) vaccine, lactose, other, measles and rubella virus vaccine, and reglan [metoclopramide].   HPI  Today, she is being contacted for a post-procedure assessment.   Post-procedure evaluation   Type: Trans-Foraminal Epidural Steroid Injection (Lumbar) #2  Laterality: Right  Level: L5           Imaging: Fluoroscopic guidance Anesthesia: Local anesthesia (1-2% Lidocaine) Anxiolysis: None                 Sedation: None. DOS: 11/18/2021  Performed by: BGillis Santa MD  Purpose: Diagnostic/Therapeutic Indications: Lumbar radicular pain severe enough to impact quality of life or function. 1. Lumbar radiculopathy   2. Protrusion of lumbar intervertebral disc   3. Lumbosacral radiculopathy  at L5 (right)     NAS-11 Pain score:   Pre-procedure: 5 /10   Post-procedure: 3 /10  Effectiveness:  Initial hour after procedure: 30 %  Subsequent 4-6 hours post-procedure: 30 %  Analgesia past initial 6 hours: 10 % (trailed off after the first day.)  Ongoing improvement:  Analgesic:  10%, states pain is more tolerable and states that she is sleeping better   Laboratory Chemistry Profile   Renal Lab Results  Component Value Date   BUN 11 05/02/2013   CREATININE 0.80 05/02/2013   GFRAA >60 05/02/2013   GFRNONAA >60 05/02/2013    Hepatic Lab Results  Component Value Date   AST 16 05/02/2013   ALT 20 05/02/2013   ALBUMIN 3.4 05/02/2013   ALKPHOS 87 05/02/2013    Electrolytes Lab Results  Component Value Date   NA 136 05/02/2013   K 3.6 05/02/2013   CL 106 05/02/2013   CALCIUM 9.1 05/02/2013   MG 1.8 09/13/2012    Bone No results found for: "VD25OH", "VD125OH2TOT", "BW6203TD9", "RC1638GT3", "25OHVITD1", "25OHVITD2", "25OHVITD3", "TESTOFREE", "TESTOSTERONE"  Inflammation (CRP: Acute Phase) (ESR: Chronic Phase) No results found for: "CRP", "ESRSEDRATE", "LATICACIDVEN"       Note: Above Lab results reviewed.   Assessment  The primary encounter diagnosis was Lumbar radiculopathy. Diagnoses of Protrusion of lumbar intervertebral disc and Lumbosacral radiculopathy at L5 (right) were also pertinent to this visit.  Plan of Care   Encourage patient to continue working with physical therapy and nutrition and dietitian which will help facilitate weight loss. Follow-up as needed.    Follow-up plan:   Return REPEAT TF ESI PRN.     R [id number] 10/12/21, 11/18/21   Recent Visits Date Type Provider Dept  11/18/21 Procedure visit Gillis Santa, MD [facility]  11/10/21 Office Visit Gillis Santa, MD [facility]  10/12/21 Procedure visit Gillis Santa, MD [facility]  09/30/21 Office Visit Gillis Santa, MD [facility]  Showing  recent visits within past 90 days and meeting all other requirements Today's Visits Date Type Provider Dept  12/15/21 Office Visit Gillis Santa, MD [facility]  Showing today's visits and meeting all other requirements Future Appointments No visits were found meeting these conditions. Showing future appointments within next 90 days and meeting all other requirements  I discussed the assessment and treatment plan with the patient. The patient was provided an opportunity to ask questions and all were answered. The patient agreed with the plan and demonstrated an understanding of the instructions.  Patient advised to call back or seek an in-person evaluation if the symptoms or condition worsens.  Duration of encounter: 30mnutes.  Note by: BGillis Santa MD Date: 12/15/2021; Time: 11:41 AM

## 2021-12-24 ENCOUNTER — Encounter: Payer: Self-pay | Admitting: Dietician

## 2021-12-24 ENCOUNTER — Encounter: Payer: 59 | Attending: Physical Medicine & Rehabilitation | Admitting: Dietician

## 2021-12-24 VITALS — Ht 64.0 in | Wt 348.6 lb

## 2021-12-24 DIAGNOSIS — M5126 Other intervertebral disc displacement, lumbar region: Secondary | ICD-10-CM | POA: Insufficient documentation

## 2021-12-24 DIAGNOSIS — I10 Essential (primary) hypertension: Secondary | ICD-10-CM | POA: Insufficient documentation

## 2021-12-24 DIAGNOSIS — Z6841 Body Mass Index (BMI) 40.0 and over, adult: Secondary | ICD-10-CM | POA: Insufficient documentation

## 2021-12-24 NOTE — Patient Instructions (Signed)
Great job making healthy choices and controlling portions! Keep it up! Continue to monitor calories and aim for around 1400 daily (max 1500).  Include a small amount of light activity as tolerated.  Try using calorieking.com to check calorie content and nutritional information for foods. Can also be used to track food intake.

## 2022-01-04 ENCOUNTER — Other Ambulatory Visit: Payer: Self-pay | Admitting: Physical Medicine & Rehabilitation

## 2022-01-04 DIAGNOSIS — G8929 Other chronic pain: Secondary | ICD-10-CM

## 2022-01-04 DIAGNOSIS — M48062 Spinal stenosis, lumbar region with neurogenic claudication: Secondary | ICD-10-CM

## 2022-01-12 ENCOUNTER — Ambulatory Visit
Admission: RE | Admit: 2022-01-12 | Discharge: 2022-01-12 | Disposition: A | Discharge status: Home / Self Care | Payer: 59 | Source: Ambulatory Visit | Attending: Physical Medicine & Rehabilitation | Admitting: Physical Medicine & Rehabilitation

## 2022-01-12 DIAGNOSIS — M48062 Spinal stenosis, lumbar region with neurogenic claudication: Secondary | ICD-10-CM | POA: Insufficient documentation

## 2022-01-12 DIAGNOSIS — G8929 Other chronic pain: Secondary | ICD-10-CM | POA: Insufficient documentation

## 2022-01-12 DIAGNOSIS — M5441 Lumbago with sciatica, right side: Secondary | ICD-10-CM | POA: Diagnosis present

## 2022-02-04 ENCOUNTER — Ambulatory Visit: Payer: 59 | Admitting: Dietician

## 2022-02-22 NOTE — Progress Notes (Unsigned)
Referring Physician:  Doyle Askew, MD [place],  [place] [id number]  Primary Physician:  Gayland Curry, MD  History of Present Illness: 02/23/2022 [name] is here today with a chief complaint of low back pain traveling down the right leg, she is also complaining of numbness, tingling and weakness in the right leg.  Is been having pain for over a year.  She has sharp and aching pain as bad as 10 out of 10 down the back of her right leg.  She gets some pain in her anterolateral calf as well.  Standing, walking, lifting, and twisting make her pain worse.  Laying down and resting makes it better.  She can only walk 50 to 100 feet without pain.  When she stands in 1 position for more than a minute and a half she begins having severe pain down her right leg. Bowel/Bladder Dysfunction: none  Conservative measures:  Physical therapy: has participated in at [facility] without relief Multimodal medical therapy including regular antiinflammatories: naproxen, tylenol, gabapentin.  Injections: has had epidural steroid injections 11/18/21: Right L5 TF ESI by Dr. Holley Raring (no relief) 10/12/21: Right L5 TF ESI by Dr. Holley Raring (no relief)  Past Surgery:  denies  Debra Morales has no symptoms of cervical myelopathy.  The symptoms are causing a significant impact on the patient's life.   Review of Systems:  A 10 point review of systems is negative, except for the pertinent positives and negatives detailed in the HPI.  Past Medical History: Past Medical History:  Diagnosis Date   Asthma    Depression    Iron deficiency anemia    Sleep apnea    Vitamin D deficiency     Past Surgical History: Past Surgical History:  Procedure Laterality Date   LAPAROSCOPIC GASTRIC SLEEVE RESECTION  2016    Allergies: Allergies as of 02/23/2022 - Review Complete 02/23/2022  Allergen Reaction Noted   Covid-19 (mrna) vaccine Other (See Comments)  07/28/2020   Lactose Other (See Comments), Diarrhea, and Nausea Only 03/05/2020   Other Anaphylaxis 08/11/2011   Measles and rubella virus vaccine  08/17/2016   Reglan [metoclopramide] Palpitations 08/17/2016    Medications: Current Meds  Medication Sig   acetaminophen (TYLENOL) 325 MG tablet Take 650 mg by mouth every 6 (six) hours as needed.   albuterol (PROVENTIL HFA;VENTOLIN HFA) 108 (90 Base) MCG/ACT inhaler Inhale into the lungs every 6 (six) hours as needed for wheezing or shortness of breath.   amitriptyline (ELAVIL) 50 MG tablet Take 50 mg by mouth at bedtime.   ARIPiprazole (ABILIFY) 5 MG tablet 10 mg 2 (two) times daily.   ascorbic acid (VITAMIN C) 500 MG tablet Take by mouth.   aspirin-acetaminophen-caffeine (EXCEDRIN MIGRAINE) 250-250-65 MG tablet Take by mouth.   azelastine (ASTELIN) 0.1 % nasal spray Place into the nose.   cholecalciferol (VITAMIN D) 1000 units tablet Take 1,000 Units by mouth daily.   clonazePAM (KLONOPIN) 0.5 MG tablet 0.5 mg. 1/2 tab two times per day and prn   DULoxetine (CYMBALTA) 60 MG capsule Take 60 mg by mouth 2 (two) times daily.   ferrous sulfate 325 (65 FE) MG EC tablet Take 325 mg by mouth daily.   gabapentin (NEURONTIN) 300 MG capsule Take 300 mg by mouth 3 (three) times daily. 1 in a.m., 1 at noon, 2 at night   hydrochlorothiazide (HYDRODIURIL) 25 MG tablet Take 1 tablet by mouth daily.   Ibuprofen (ADVIL MIGRAINE PO) Take by mouth.  norethindrone-ethinyl estradiol (JUNEL FE,GILDESS FE,LOESTRIN FE) 1-20 MG-MCG tablet Take 1 tablet by mouth daily.   nortriptyline (PAMELOR) 25 MG capsule Take by mouth.   nortriptyline (PAMELOR) 50 MG capsule Take 50 mg by mouth at bedtime. 25 mg in a.m., 75 mg at p.m.   omeprazole (PRILOSEC) 20 MG capsule Take 20 mg by mouth daily.   ondansetron (ZOFRAN ODT) 8 MG disintegrating tablet Take 1 tablet (8 mg total) by mouth 2 (two) times daily.   Prenatal 28-0.8 MG TABS Take by mouth.   topiramate (TOPAMAX) 25  MG tablet TAKE 25 MG AT NIGHT FOR A WEEK THEN INCREASE TO 50 MG AT NIGHT FOR A WEEK THEN INCREASE TO 75 MG AT NIGHT    Social History: Social History   Tobacco Use   Smoking status: Never   Smokeless tobacco: Never  Substance Use Topics   Alcohol use: No   Drug use: No    Family Medical History: Family History  Problem Relation Age of Onset   Hypertension Mother    Epilepsy Mother    Multiple sclerosis Mother    Depression Mother    Diabetes Mother    Diverticulitis Mother    Diabetes Father     Physical Examination: Vitals:   02/23/22 1415  BP: 138/86    General: Patient is well developed, well nourished, calm, collected, and in no apparent distress. Attention to examination is appropriate.  Neck:   Supple.  Full range of motion.  Respiratory: Patient is breathing without any difficulty.   NEUROLOGICAL:     Awake, alert, oriented to person, place, and time.  Speech is clear and fluent. Fund of knowledge is appropriate.   Cranial Nerves: Pupils equal round and reactive to light.  Facial tone is symmetric.  Facial sensation is symmetric. Shoulder shrug is symmetric. Tongue protrusion is midline.  There is no pronator drift.  ROM of spine: full.    Strength: Side Biceps Triceps Deltoid Interossei Grip Wrist Ext. Wrist Flex.  R 5 5 5 5 5 5 5  L 5 5 5 5 5 5 5   Side Iliopsoas Quads Hamstring PF DF EHL  R 5 5 5 5 5 5  L 5 5 5 5 5 5   Reflexes are 1+ and symmetric at the biceps, triceps, brachioradialis, patella and achilles.   Hoffman's is absent.  Clonus is not present.  Toes are down-going.  Bilateral upper and lower extremity sensation is intact to light touch.    No evidence of dysmetria noted.    Medical Decision Making  Imaging: MRI L spine 01/12/22 IMPRESSION: 1. Right subarticular disc protrusion at L4-5, impinging upon the descending right L5 nerve root in the right lateral recess. 2. Central to right subarticular disc protrusion at L3-4,  closely approximating and potentially irritating the descending right L4 nerve root. 3. Shallow central disc protrusion at L5-S1, closely approximating the descending S1 nerve roots without frank impingement, slightly greater on the right. 4. Overall, appearance of the lumbar spine is relatively stable since 07/24/2021.     Electronically Signed   By: Benjamin  McClintock M.D.   On: 01/14/2022 06:10  I have personally reviewed the images and agree with the above interpretation.  Assessment and Plan: Ms. Fleischhacker is a pleasant 36 y.o. 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  She has tried and failed conservative management including physical therapy, medications, and injections.  At this point, no further conservative management   is indicated.  We discussed microdiscectomy at L4-5.  At her current weight, perioperative morbidity would be prohibitive.  She has already lost 30 pounds.  We have set a goal weight of 275 pounds.  When she has met the goal weight, I have asked her to contact me and we will schedule her surgery.  We also discussed warning signs for return to medical attention including weakness or the presence of symptoms or signs of cauda equina syndrome.  If those were to occur, we will move forward with surgical intervention despite the risks.  I spent a total of 30 minutes in face-to-face and non-face-to-face activities related to this patient's care today.  Thank you for involving me in the care of this patient.      Chester K. Yarbrough MD, MPHS Neurosurgery  

## 2022-02-23 ENCOUNTER — Ambulatory Visit (INDEPENDENT_AMBULATORY_CARE_PROVIDER_SITE_OTHER): Payer: 59 | Admitting: Neurosurgery

## 2022-02-23 ENCOUNTER — Encounter: Payer: Self-pay | Admitting: Neurosurgery

## 2022-02-23 VITALS — BP 138/86 | Ht 64.0 in | Wt 331.0 lb

## 2022-02-23 DIAGNOSIS — M5416 Radiculopathy, lumbar region: Secondary | ICD-10-CM | POA: Diagnosis not present

## 2022-03-04 ENCOUNTER — Encounter: Payer: 59 | Attending: Physical Medicine & Rehabilitation | Admitting: Dietician

## 2022-03-04 ENCOUNTER — Encounter: Payer: Self-pay | Admitting: Dietician

## 2022-03-04 VITALS — Ht 64.0 in | Wt 327.1 lb

## 2022-03-04 DIAGNOSIS — I10 Essential (primary) hypertension: Secondary | ICD-10-CM | POA: Insufficient documentation

## 2022-03-04 DIAGNOSIS — Z6841 Body Mass Index (BMI) 40.0 and over, adult: Secondary | ICD-10-CM | POA: Insufficient documentation

## 2022-03-04 DIAGNOSIS — M5126 Other intervertebral disc displacement, lumbar region: Secondary | ICD-10-CM | POA: Insufficient documentation

## 2022-03-04 NOTE — Progress Notes (Signed)
Medical Nutrition Therapy: Visit start time: 1520  end time: 1550  Assessment:  Diagnosis: obesity, s/p bariatric surgery Medical history changes: no changes Psychosocial issues/ stress concerns: none  Medications, supplement changes: reconciled list in medical record   Current weight: 327.1lbs Height: 5'4" BMI: 56.15  Progress and evaluation:  Continues to take GoLo supplement with most meals. 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 She has been advised to lose an additional 50lbs to qualify for surgery.    Dietary Intake:  Usual eating pattern includes 3 meals and 1 snacks per day. Dining out frequency: 0-2 meals per week.  Breakfast: cottage cheese and fruit ie blueberries; chobani yogurt flips; leftover veg + meat (often later meal then counts as breakfast and lunch) Snack: none Lunch: snacks on nuts if eating brunch; (no ramen recently); tortilla wrap with [place]; cottage cheese and fruit Snack: fruit; rarely small portion milkshake or frappucino Supper: baked chicken/ fish/ pork tenderloin occ casserole or spaghetti/ stuffed peppers or cabbage/ frittata/ fajitas + always includes veggies  Snack: none Beverages: water, patient feels less recently; diet soda  Physical activity: limited due to back pain  Intervention:   Nutrition Care Education:  Basic nutrition: reviewed appropriate meal and snack schedule; general nutrition guidelines    Weight control: reviewed progress since previous visit; managing weight loss plateaus; prescription weight loss medication options   Nutritional Diagnosis:  [place]-3.3 Overweight/obesity As related to history of obesity, history of bariatric surgery, inactivity due  to back pain.  As evidenced by patient with current BMI of 56.15, following low fat and low sugar eating pattern to promote ongoing weight loss.  Other Intervention notes: Commended patient for progress made She will plan to discuss weight loss meds further with her provider(s).  No new nutrition goals at this time, patient will continue with current eating pattern.  Education Materials given:  Visit summary with goals/ instructions   Learner/ who was taught:  Patient  Spouse/ partner   Level of understanding: Verbalizes/ demonstrates competency   Demonstrated degree of understanding via:   Teach back Learning barriers: None  Willingness to learn/ readiness for change: Eager, change in progress   Monitoring and Evaluation:  Dietary intake, exercise, and body weight      follow up:  05/05/22 at 3:30pm

## 2022-03-04 NOTE — Patient Instructions (Signed)
Continue with current eating pattern.  Incorporate tolerable and safe physical activity as able.

## 2022-05-05 ENCOUNTER — Encounter: Payer: Self-pay | Admitting: Dietician

## 2022-05-05 ENCOUNTER — Encounter: Payer: 59 | Attending: Physical Medicine & Rehabilitation | Admitting: Dietician

## 2022-05-05 VITALS — Ht 64.0 in | Wt 317.2 lb

## 2022-05-05 DIAGNOSIS — Z6841 Body Mass Index (BMI) 40.0 and over, adult: Secondary | ICD-10-CM | POA: Diagnosis present

## 2022-05-05 DIAGNOSIS — M5126 Other intervertebral disc displacement, lumbar region: Secondary | ICD-10-CM | POA: Diagnosis present

## 2022-05-05 DIAGNOSIS — I10 Essential (primary) hypertension: Secondary | ICD-10-CM | POA: Diagnosis not present

## 2022-05-05 NOTE — Progress Notes (Unsigned)
Medical Nutrition Therapy: Visit start time: 6045  end time: 4098  Assessment:  Diagnosis: obesity Medical history changes: no changes Psychosocial issues/ stress concerns: none  Medications, supplement changes: updated list in medical record   Current weight: 317.2lbs Height: 5'4" BMI: 54.45  Progress and evaluation:  Patient reports weight fluctuation/ feels like she has been  Reports increase in appetite for past 2-3 weeks, with some occasional increase in calories. Most days close to 1400 calories, has one day weekly as "free" day and then averages  Has prescirption for Ozempic and waiting for backorder to be filled.    Dietary Intake:  Usual eating pattern includes 3 meals and 0-1 snacks per day. Dining out frequency: 0-2 meals per week.  Breakfast: [place] Clap breakfast bowl <300kcal; cottage cheese with fruit (mostly strawberries); [place] Balance break -- meat, cheese, dried fruit Snack: none Lunch: smart ones frozen meals; leftovers;  Snack:  occ 1-2 cheese sticks; cashews small portion; wafer cracker with almond butter or peanut butter (190 kcal); small portion chocolate Supper: spaghetti with zucchini noodles in place of pasta; lean meat + 1-2 veg; hamburger with no bun; tacos Snack: none Beverages: mostly water; koolaid with splenda; occ diet soda  Physical activity: some walking dependent on back pain.   Intervention:   Nutrition Care Education:  Basic nutrition: reviewed basic appropriate nutrient balance; appropriate meal and snack schedule; general nutrition guidelines    Weight control: reviewed progress since previous visit; weight loss plateaus; role of physical activity and plan for manageable activity without increasing pain    Nutritional Diagnosis: [place]-3.3 Overweight/obesity As related to history of obesity, history of bariatric surgery, inactivity due to back pain.  As evidenced by patient with current BMI of 54.45, following low fat and low sugar eating  pattern to promote ongoing weight loss.    Education Materials given:  Tips for Managing Food Cravings 101 Things to do Besides Eating Visit summary and goals/ instructions to be viewed via patient portal   Learner/ who was taught:  Patient  Spouse/ partner  Level of understanding: Verbalizes/ demonstrates competency   Demonstrated degree of understanding via:   Teach back Learning barriers: None  Willingness to learn/ readiness for change: Eager, change in progress   Monitoring and Evaluation:  Dietary intake, exercise, and body weight      follow up:  10/04/22 at 3:30pm

## 2022-05-05 NOTE — Patient Instructions (Addendum)
Keep some measure of mindful eating on "free" days Gradually increase physical activity while avoiding severe back pain; can be a few minutes at a time, more than once a day, with slow increase in duration and/or frequency.

## 2022-05-14 ENCOUNTER — Ambulatory Visit
Admission: EM | Admit: 2022-05-14 | Discharge: 2022-05-14 | Disposition: A | Discharge status: Home / Self Care | Payer: 59 | Attending: Physician Assistant | Admitting: Physician Assistant

## 2022-05-14 DIAGNOSIS — R0600 Dyspnea, unspecified: Secondary | ICD-10-CM | POA: Diagnosis not present

## 2022-05-14 DIAGNOSIS — J45909 Unspecified asthma, uncomplicated: Secondary | ICD-10-CM | POA: Diagnosis not present

## 2022-05-14 DIAGNOSIS — R233 Spontaneous ecchymoses: Secondary | ICD-10-CM | POA: Insufficient documentation

## 2022-05-14 LAB — CBC WITH DIFFERENTIAL/PLATELET
Abs Immature Granulocytes: 0.03 10*3/uL (ref 0.00–0.07)
Basophils Absolute: 0 10*3/uL (ref 0.0–0.1)
Basophils Relative: 0 %
Eosinophils Absolute: 0.4 10*3/uL (ref 0.0–0.5)
Eosinophils Relative: 3 %
HCT: 38.1 % (ref 36.0–46.0)
Hemoglobin: 12.5 g/dL (ref 12.0–15.0)
Immature Granulocytes: 0 %
Lymphocytes Relative: 26 %
Lymphs Abs: 3.3 10*3/uL (ref 0.7–4.0)
MCH: 27.4 pg (ref 26.0–34.0)
MCHC: 32.8 g/dL (ref 30.0–36.0)
MCV: 83.6 fL (ref 80.0–100.0)
Monocytes Absolute: 0.7 10*3/uL (ref 0.1–1.0)
Monocytes Relative: 6 %
Neutro Abs: 8 10*3/uL — ABNORMAL HIGH (ref 1.7–7.7)
Neutrophils Relative %: 65 %
Platelets: 424 10*3/uL — ABNORMAL HIGH (ref 150–400)
RBC: 4.56 MIL/uL (ref 3.87–5.11)
RDW: 14.3 % (ref 11.5–15.5)
WBC: 12.5 10*3/uL — ABNORMAL HIGH (ref 4.0–10.5)
nRBC: 0 % (ref 0.0–0.2)

## 2022-05-14 LAB — URINALYSIS, ROUTINE W REFLEX MICROSCOPIC
Bilirubin Urine: NEGATIVE
Glucose, UA: NEGATIVE mg/dL
Hgb urine dipstick: NEGATIVE
Ketones, ur: NEGATIVE mg/dL
Leukocytes,Ua: NEGATIVE
Nitrite: NEGATIVE
Protein, ur: NEGATIVE mg/dL
Specific Gravity, Urine: 1.025 (ref 1.005–1.030)
pH: 5.5 (ref 5.0–8.0)

## 2022-05-14 LAB — COMPREHENSIVE METABOLIC PANEL
ALT: 33 U/L (ref 0–44)
AST: 19 U/L (ref 15–41)
Albumin: 3.5 g/dL (ref 3.5–5.0)
Alkaline Phosphatase: 75 U/L (ref 38–126)
Anion gap: 7 (ref 5–15)
BUN: 11 mg/dL (ref 6–20)
CO2: 25 mmol/L (ref 22–32)
Calcium: 9.2 mg/dL (ref 8.9–10.3)
Chloride: 101 mmol/L (ref 98–111)
Creatinine, Ser: 0.68 mg/dL (ref 0.44–1.00)
GFR, Estimated: >60 mL/min (ref >60)
Glucose, Bld: 110 mg/dL — ABNORMAL HIGH (ref 70–99)
Potassium: 3.5 mmol/L (ref 3.5–5.1)
Sodium: 133 mmol/L — ABNORMAL LOW (ref 135–145)
Total Bilirubin: 0.3 mg/dL (ref 0.3–1.2)
Total Protein: 7.4 g/dL (ref 6.5–8.1)

## 2022-05-14 LAB — MONONUCLEOSIS SCREEN: Mono Screen: NEGATIVE

## 2022-05-14 LAB — SEDIMENTATION RATE: Sed Rate: 59 mm/hr — ABNORMAL HIGH (ref 0–20)

## 2022-05-14 NOTE — Discharge Instructions (Addendum)
-  So far your labs are reassuring.  Your platelet count is not low.  It is slightly high but I was more concerned about it being low.  Your kidney function, liver function are normal.  The urine test is normal.  Mono is negative.  I am still awaiting some test results.  Awaiting a [facility] spotted fever test and your coagulation tests.  Those results will be back in the next couple of days. - I am unsure as to the cause of your petechial rash at this time.  I have placed a referral to dermatology for you but if you start to have any fevers, weakness, severe headaches, dizziness, fatigue, chest pain, increased shortness of breath, abdominal pain, vomiting or noticed blood in your stool or urine, you should go to the emergency department otherwise follow-up with dermatology and your primary care provider.  You may try an antihistamine such as Benadryl to see if that helps.

## 2022-05-14 NOTE — ED Triage Notes (Signed)
Pt c/o "dots" appearing over arms, stomach, and thighs x2days  Pt denies doing anything new or trying new detergents for her clothes.

## 2022-05-14 NOTE — ED Provider Notes (Signed)
[facility]    CSN: [id number] Arrival date & time: 05/14/22  1455      History   Chief Complaint Chief Complaint  Patient presents with   Rash    HPI Hiromi Knodel is a 36 y.o. female presenting for red macular and papular rash on abdomen, upper legs, arms, chest and back since yesterday.  She denies any other symptoms.  She has not had any fevers.  No increased fatigue from base time.  Denies headaches, dizziness, weakness, chest pain, cough, congestion, sore throat, swollen lymph nodes, abdominal pain, nausea/vomiting or diarrhea.  No report of any blood in the urine.  Has never had a rash like this before.  Denies any potential triggers for the rash.  No new detergents or topical ointments/creams, perfumes.  Denies any new medications.  Denies any known tick bites.  She reports a little bit more shortness of breath than normal but does report a history of asthma and sleep apnea.  Has not needed to use inhaler.  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      Home Medications    Prior to Admission medications   Medication Sig Start Date End Date Taking? Authorizing Provider  acetaminophen (TYLENOL) 325 MG tablet Take 650 mg by mouth every 6 (six) hours as needed.   Yes [provider]  albuterol (PROVENTIL HFA;VENTOLIN HFA) 108 (90 Base) MCG/ACT inhaler Inhale into the lungs  every 6 (six) hours as needed for wheezing or shortness of breath.   Yes [provider]  amitriptyline (ELAVIL) 50 MG tablet Take 50 mg by mouth at bedtime.   Yes [provider]  ARIPiprazole (ABILIFY) 5 MG tablet 10 mg 2 (two) times daily. 11/12/20  Yes [provider]  ascorbic acid (VITAMIN C) 500 MG tablet Take by mouth.   Yes [provider]  aspirin-acetaminophen-caffeine (EXCEDRIN MIGRAINE) [phone] MG tablet Take by mouth.   Yes [provider]  azelastine (ASTELIN) 0.1 % nasal spray Place into the nose. 04/29/17  Yes [provider]  cholecalciferol (VITAMIN D) 1000 units tablet Take 1,000 Units by mouth daily.   Yes [provider]  clonazePAM (KLONOPIN) 0.5 MG tablet 0.5 mg. 1/2 tab two times per day and prn 11/12/20  Yes [provider]  DULoxetine (CYMBALTA) 60 MG capsule Take 60 mg by mouth 2 (two) times daily.   Yes [provider]  ferrous sulfate 325 (65 FE) MG EC tablet Take 325 mg by mouth daily.   Yes [provider]  gabapentin (NEURONTIN) 300 MG capsule Take 300 mg by mouth 3 (three) times daily. 1 in a.m., 1 at noon, 2 at night   Yes [provider]  Galcanezumab-gnlm (EMGALITY) 120 MG/ML SOAJ Inject into the skin.   Yes [provider]  hydrochlorothiazide (HYDRODIURIL) 25 MG tablet Take  1 tablet by mouth daily. 08/03/21  Yes [provider]  Ibuprofen (ADVIL MIGRAINE PO) Take by mouth.   Yes [provider]  norethindrone-ethinyl estradiol (JUNEL FE,GILDESS FE,LOESTRIN FE) 1-20 MG-MCG tablet Take 1 tablet by mouth daily.   Yes [provider]  nortriptyline (PAMELOR) 25 MG capsule Take by mouth. 11/16/21  Yes [provider]  nortriptyline (PAMELOR) 50 MG capsule Take 50 mg by mouth at bedtime. 25 mg in a.m., 75 mg at p.m. 08/17/21  Yes [provider]  omeprazole (PRILOSEC) 20 MG capsule Take 20 mg by mouth daily.   Yes  [provider]  ondansetron (ZOFRAN ODT) 8 MG disintegrating tablet Take 1 tablet (8 mg total) by mouth 2 (two) times daily. 08/17/16  Yes Lorin Picket, PA-C  Prenatal 28-0.8 MG TABS Take by mouth.   Yes [provider]  topiramate (TOPAMAX) 25 MG tablet TAKE 25 MG AT NIGHT FOR A WEEK THEN INCREASE TO 50 MG AT NIGHT FOR A WEEK THEN INCREASE TO 75 MG AT NIGHT 09/11/21  Yes [provider]  hydrochlorothiazide (HYDRODIURIL) 25 MG tablet Take 1 tablet by mouth daily. 07/28/20 11/23/21  [provider]  valsartan (DIOVAN) 160 MG tablet Take by mouth. 03/05/20 11/23/21  [provider]    Family History Family History  Problem Relation Age of Onset   Hypertension Mother    Epilepsy Mother    Multiple sclerosis Mother    Depression Mother    Diabetes Mother    Diverticulitis Mother    Diabetes Father     Social History Social History   Tobacco Use   Smoking status: Never   Smokeless tobacco: Never  Vaping Use   Vaping Use: Never used  Substance Use Topics   Alcohol use: No    Comment: rarely, 2 drinks in past 2 months   Drug use: No     Allergies   Covid-19 (mrna) vaccine, Lactose, Other, Measles and rubella virus vaccine, and Reglan [metoclopramide]   Review of Systems Review of Systems  Constitutional:  Negative for appetite change, fatigue and fever.  HENT:  Negative for congestion, rhinorrhea and sore throat.   Respiratory:  Positive for shortness of breath. Negative for cough.   Cardiovascular:  Negative for chest pain, palpitations and leg swelling.  Gastrointestinal:  Negative for abdominal pain, blood in stool, diarrhea, nausea and vomiting.  Genitourinary:  Negative for flank pain and hematuria.  Musculoskeletal:  Negative for arthralgias and joint swelling.  Skin:  Positive for rash. Negative for wound.  Neurological:  Negative for dizziness, weakness and headaches.  Hematological:  Negative for adenopathy.   Psychiatric/Behavioral:  Negative for confusion.      Physical Exam Triage Vital Signs ED Triage Vitals  Enc Vitals Group     BP 05/14/22 1505 (!) 143/90     Pulse Rate 05/14/22 1505 95     Resp 05/14/22 1505 18     Temp 05/14/22 1505 98.4 F (36.9 C)     Temp Source 05/14/22 1505 Oral     SpO2 05/14/22 1505 98 %     Weight 05/14/22 1503 (!) 317 lb (143.8 kg)     Height 05/14/22 1503 _0  (1.626 m)     Head Circumference --      Peak Flow --      Pain Score 05/14/22 1503 5     Pain Loc --      Pain Edu? --      Excl. in  GC? --    No data found.  Updated Vital Signs BP (!) 143/90 (BP Location: Left Arm)   Pulse 95   Temp 98.4 F (36.9 C) (Oral)   Resp 18   Ht _0  (1.626 m)   Wt (!) 317 lb (143.8 kg)   LMP 04/30/2022   SpO2 98%   BMI 54.41 kg/m      Physical Exam Vitals and nursing note reviewed.  Constitutional:      General: She is not in acute distress.    Appearance: Normal appearance. She is obese. She is not ill-appearing or toxic-appearing.  HENT:     Head: Normocephalic and atraumatic.     Nose: Nose normal.     Mouth/Throat:     Mouth: Mucous membranes are moist.     Pharynx: Oropharynx is clear.  Eyes:     General: No scleral icterus.       Right eye: No discharge.        Left eye: No discharge.     Conjunctiva/sclera: Conjunctivae normal.  Cardiovascular:     Rate and Rhythm: Normal rate and regular rhythm.     Heart sounds: Normal heart sounds.  Pulmonary:     Effort: Pulmonary effort is normal. No respiratory distress.     Breath sounds: Normal breath sounds.  Musculoskeletal:     Cervical back: Neck supple.  Skin:    General: Skin is dry.     Findings: Rash present.     Comments: Image below is of the right thigh.  Tiny macules and petechia visible on abdomen, chest, back, forearms and thighs.  There are also a few areas of papules but the rash is mostly flat.  Neurological:     General: No focal deficit present.     Mental  Status: She is alert. Mental status is at baseline.     Motor: No weakness.     Gait: Gait normal.  Psychiatric:        Mood and Affect: Mood normal.        Behavior: Behavior normal.        Thought Content: Thought content normal.      UC Treatments / Results  Labs (all labs ordered are listed, but only abnormal results are displayed) Labs Reviewed  CBC WITH DIFFERENTIAL/PLATELET - Abnormal; Notable for the following components:      Result Value   WBC 12.5 (*)    Platelets 424 (*)    Neutro Abs 8.0 (*)    All other components within normal limits  COMPREHENSIVE METABOLIC PANEL - Abnormal; Notable for the following components:   Sodium 133 (*)    Glucose, Bld 110 (*)    All other components within normal limits  SEDIMENTATION RATE - Abnormal; Notable for the following components:   Sed Rate 59 (*)    All other components within normal limits  MONONUCLEOSIS SCREEN  URINALYSIS, ROUTINE W REFLEX MICROSCOPIC  ROCKY MTN SPOTTED FVR ABS PNL(IGG+IGM)    EKG   Radiology No results found.  Procedures Procedures (including critical care time)  Medications Ordered in UC Medications - No data to display  Initial Impression / Assessment and Plan / UC Course  I have reviewed the triage vital signs and the nursing notes.  Pertinent labs & imaging results that were available during my care of the patient were reviewed by me and considered in my medical decision making (see chart for details).   36 year old female presents for petechial rash throughout  body since yesterday.  Also reports a small amount of shortness of breath but does have history of asthma.  Has not needed to use inhaler.  Oxygen is 98%.  No fever, URI symptoms.  No chest pain no palpitations, dizziness, headaches, weakness, abdominal pain, nausea/vomiting or diarrhea.  No sick contacts.  No known tick bites.  No change to medications or detergents.  Vitals are stable.  She is afebrile and overall well-appearing.   I have included an image in the chart of patient's petechial rash.  It is mostly tiny red macules and petechia but there are a few areas of tiny papules.  Normal HEENT exam.  No lesions in or around mouth.  Chest clear to auscultation and heart regular rate and rhythm.  CBC performed shows slightly elevated WBC count of 12.5.  Platelets are little elevated at 424. CMP performed shows slightly decreased sodium to 133 and increased glucose of 110.  Normal kidney and liver function. Urinalysis performed shows no acute abnormality ESR elevated at 59. Other testing performed includes RMSF and coagulation panel but lab canceled coagulation panel due to not enough blood after patient had already been discharged.  Discussed the lab results of the CBC, CMP, UA and ESR with patient.  Advised her that we will contact her once we get the other test result and treat her for that if positive.  If negative I am unsure as to the cause of her petechial rash.  May have underlying vasculitis.  Advised her to follow-up with her PCP and also placed a referral to dermatology for her.  We also discussed other possibilities the rash such as viral illnesses.  She does not really have any symptoms at this time so I advised her to closely monitor herself and if she does develop any symptoms such as fever, URI symptoms, chest pain, increased shortness of breath not due to asthma and responding to inhaler, abdominal pain, nausea/vomiting, blood in urine or stool, easy bleeding, flank pain, extremity swelling or joint pains, myalgias to return or go to emergency department for further and more expedited work-up.  Patient is agreeable to plan.   Final Clinical Impressions(s) / UC Diagnoses   Final diagnoses:  Petechial rash  Dyspnea, unspecified type  Uncomplicated asthma, unspecified asthma severity, unspecified whether persistent     Discharge Instructions      -So far your labs are reassuring.  Your platelet count is not  low.  It is slightly high but I was more concerned about it being low.  Your kidney function, liver function are normal.  The urine test is normal.  Mono is negative.  I am still awaiting some test results.  Awaiting a [facility] spotted fever test and your coagulation tests.  Those results will be back in the next couple of days. - I am unsure as to the cause of your petechial rash at this time.  I have placed a referral to dermatology for you but if you start to have any fevers, weakness, severe headaches, dizziness, fatigue, chest pain, increased shortness of breath, abdominal pain, vomiting or noticed blood in your stool or urine, you should go to the emergency department otherwise follow-up with dermatology and your primary care provider.  You may try an antihistamine such as Benadryl to see if that helps.     ED Prescriptions   None    PDMP not reviewed this encounter.   Danton Clap, PA-C 05/15/22 [phone]

## 2022-05-18 LAB — RMSF, IGG, IFA: RMSF, IGG, IFA: 1:64 {titer} — ABNORMAL HIGH

## 2022-05-18 LAB — ROCKY MTN SPOTTED FVR ABS PNL(IGG+IGM)
RMSF IgG: POSITIVE — AB
RMSF IgM: 0.44 index (ref 0.00–0.89)

## 2022-05-19 ENCOUNTER — Telehealth (HOSPITAL_COMMUNITY): Payer: Self-pay | Admitting: Emergency Medicine

## 2022-05-19 MED ORDER — DOXYCYCLINE HYCLATE 100 MG PO CAPS: 100.0000 mg | ORAL_CAPSULE | Freq: Two times a day (BID) | ORAL | 0 refills | Status: DC

## 2022-06-08 ENCOUNTER — Ambulatory Visit: Payer: 59 | Admitting: Dermatology

## 2022-06-22 ENCOUNTER — Encounter: Payer: Self-pay | Admitting: Emergency Medicine

## 2022-06-22 DIAGNOSIS — M545 Low back pain, unspecified: Secondary | ICD-10-CM | POA: Diagnosis present

## 2022-06-22 DIAGNOSIS — M5441 Lumbago with sciatica, right side: Secondary | ICD-10-CM | POA: Diagnosis not present

## 2022-06-22 NOTE — ED Triage Notes (Signed)
Pt presents via POV with complaints of lower back pain that started this AM. Pt states hx of chronic back pain and is supposed to lose weight to have a surgeon but hasn't been cleared yet. She notes living a heavy object last night but doesn't falls or injury at this time. Denies incontinence, CP or SOB.

## 2022-06-23 ENCOUNTER — Emergency Department
Admission: EM | Admit: 2022-06-23 | Discharge: 2022-06-23 | Disposition: A | Discharge status: Home / Self Care | Payer: 59 | Attending: Emergency Medicine | Admitting: Emergency Medicine

## 2022-06-23 DIAGNOSIS — M5441 Lumbago with sciatica, right side: Secondary | ICD-10-CM

## 2022-06-23 MED ORDER — DEXAMETHASONE SODIUM PHOSPHATE 10 MG/ML IJ SOLN
10.0000 mg | Freq: Once | INTRAMUSCULAR | Status: AC
  Administered 2022-06-23: 10 mg via INTRAMUSCULAR
  Filled 2022-06-23: qty 1

## 2022-06-23 MED ORDER — HYDROCODONE-ACETAMINOPHEN 5-325 MG PO TABS: 1.0000 | ORAL_TABLET | ORAL | 0 refills | Status: DC | PRN

## 2022-06-23 MED ORDER — MORPHINE SULFATE (PF) 4 MG/ML IV SOLN
4.0000 mg | Freq: Once | INTRAVENOUS | Status: AC
  Administered 2022-06-23: 4 mg via INTRAMUSCULAR
  Filled 2022-06-23: qty 1

## 2022-06-23 MED ORDER — PREDNISONE 10 MG PO TABS: 10.0000 mg | ORAL_TABLET | Freq: Every day | ORAL | 0 refills | Status: AC

## 2022-06-23 NOTE — ED Provider Notes (Signed)
[facility] Provider Note    Event Date/Time   First MD Initiated Contact with Patient 06/23/22 0150     (approximate)  History   Chief Complaint: Back Pain  HPI  Debra Morales is a 36 y.o. female with a past medical history of obesity, depression, lower back pain, presents to the emergency department for exacerbation of lower back pain.  According to the patient for the past year or 2 she has been experiencing lower back pain, has been following up with Dr. Marcell Barlow of neurosurgery who has recommended surgery however due to the patient's obesity and they have recommended weight loss prior to attempting surgery.  Patient states she has been experiencing intermittent pain however this morning when she lifted a ham she developed significant acute pain to lower back and is essentially been incapacitated due to the pain ever since.  Denies any incontinence.  Denies any weakness of either leg but does state pain shooting down her right leg.  Physical Exam   Triage Vital Signs: ED Triage Vitals  Enc Vitals Group     BP 06/22/22 2351 134/74     Pulse Rate 06/22/22 2351 98     Resp 06/22/22 2351 18     Temp 06/22/22 2351 98.3 F (36.8 C)     Temp Source 06/22/22 2351 Oral     SpO2 06/22/22 2351 98 %     Weight 06/22/22 2350 (!) 311 lb (141.1 kg)     Height 06/22/22 2350 5\' 4"  (1.626 m)     Head Circumference --      Peak Flow --      Pain Score 06/22/22 2350 10     Pain Loc --      Pain Edu? --      Excl. in GC? --     Most recent vital signs: Vitals:   06/22/22 2351  BP: 134/74  Pulse: 98  Resp: 18  Temp: 98.3 F (36.8 C)  SpO2: 98%    General: Awake, no distress.  CV:  Good peripheral perfusion.   Resp:  Normal effort.   Abd:  No distention.      ED Results / Procedures / Treatments    MEDICATIONS ORDERED IN ED: Medications  morphine (PF) 4 MG/ML injection 4 mg (has no administration in time range)  dexamethasone (DECADRON)  injection 10 mg (has no administration in time range)     IMPRESSION / MDM / ASSESSMENT AND PLAN / ED COURSE  I reviewed the triage vital signs and the nursing notes.  Patient's presentation is most consistent with exacerbation of chronic illness.  Patient presents emergency department for acute on chronic lower back pain seem to be exacerbated after lifting a ham yesterday morning (06/22/2022).  I reviewed the patient's past MRI from July showing bulging disks with compression of the nerve roots of the right side L4 and L5.  Highly suspect given the patient's description of the onset of pain that she is exacerbated her lower back pain.  States pain shooting down the right leg as well.  No concerning findings such as weakness or bowel incontinence.  Discussed with the patient a trial of a prednisone taper as well as pain medication and have the patient follow back up with Dr. August to discuss further options.  Patient agreeable to plan of care.  FINAL CLINICAL IMPRESSION(S) / ED DIAGNOSES   Acute on chronic lower back pain Sciatica  Rx / DC Orders   Hydrocodone Prednisone  taper  Note:  This document was prepared using Dragon voice recognition software and may include unintentional dictation errors.   Minna Antis, MD 06/23/22 [phone]

## 2022-06-25 ENCOUNTER — Telehealth: Payer: Self-pay

## 2022-06-25 ENCOUNTER — Emergency Department: Payer: 59

## 2022-06-25 ENCOUNTER — Other Ambulatory Visit: Payer: Self-pay

## 2022-06-25 ENCOUNTER — Emergency Department
Admission: EM | Admit: 2022-06-25 | Discharge: 2022-06-26 | Disposition: A | Discharge status: Home / Self Care | Payer: 59 | Attending: Emergency Medicine | Admitting: Emergency Medicine

## 2022-06-25 DIAGNOSIS — M545 Low back pain, unspecified: Secondary | ICD-10-CM | POA: Diagnosis present

## 2022-06-25 DIAGNOSIS — M5416 Radiculopathy, lumbar region: Secondary | ICD-10-CM | POA: Diagnosis not present

## 2022-06-25 DIAGNOSIS — M5126 Other intervertebral disc displacement, lumbar region: Secondary | ICD-10-CM | POA: Diagnosis not present

## 2022-06-25 LAB — URINALYSIS, ROUTINE W REFLEX MICROSCOPIC
Bilirubin Urine: NEGATIVE
Glucose, UA: NEGATIVE mg/dL
Hgb urine dipstick: NEGATIVE
Ketones, ur: 5 mg/dL — AB
Nitrite: NEGATIVE
Protein, ur: NEGATIVE mg/dL
Specific Gravity, Urine: 1.034 — ABNORMAL HIGH (ref 1.005–1.030)
pH: 5 (ref 5.0–8.0)

## 2022-06-25 LAB — CBC WITH DIFFERENTIAL/PLATELET
Abs Immature Granulocytes: 0.04 10*3/uL (ref 0.00–0.07)
Basophils Absolute: 0.1 10*3/uL (ref 0.0–0.1)
Basophils Relative: 0 %
Eosinophils Absolute: 0.2 10*3/uL (ref 0.0–0.5)
Eosinophils Relative: 1 %
HCT: 41.2 % (ref 36.0–46.0)
Hemoglobin: 13 g/dL (ref 12.0–15.0)
Immature Granulocytes: 0 %
Lymphocytes Relative: 25 %
Lymphs Abs: 4.1 10*3/uL — ABNORMAL HIGH (ref 0.7–4.0)
MCH: 27 pg (ref 26.0–34.0)
MCHC: 31.6 g/dL (ref 30.0–36.0)
MCV: 85.5 fL (ref 80.0–100.0)
Monocytes Absolute: 1.1 10*3/uL — ABNORMAL HIGH (ref 0.1–1.0)
Monocytes Relative: 7 %
Neutro Abs: 10.8 10*3/uL — ABNORMAL HIGH (ref 1.7–7.7)
Neutrophils Relative %: 67 %
Platelets: 382 10*3/uL (ref 150–400)
RBC: 4.82 MIL/uL (ref 3.87–5.11)
RDW: 14.1 % (ref 11.5–15.5)
WBC: 16.3 10*3/uL — ABNORMAL HIGH (ref 4.0–10.5)
nRBC: 0 % (ref 0.0–0.2)

## 2022-06-25 LAB — BASIC METABOLIC PANEL
Anion gap: 10 (ref 5–15)
BUN: 20 mg/dL (ref 6–20)
CO2: 24 mmol/L (ref 22–32)
Calcium: 9 mg/dL (ref 8.9–10.3)
Chloride: 105 mmol/L (ref 98–111)
Creatinine, Ser: 0.7 mg/dL (ref 0.44–1.00)
GFR, Estimated: >60 mL/min (ref >60)
Glucose, Bld: 84 mg/dL (ref 70–99)
Potassium: 3.4 mmol/L — ABNORMAL LOW (ref 3.5–5.1)
Sodium: 139 mmol/L (ref 135–145)

## 2022-06-25 LAB — POC URINE PREG, ED: Preg Test, Ur: NEGATIVE

## 2022-06-25 MED ORDER — FENTANYL CITRATE PF 50 MCG/ML IJ SOSY
50.0000 ug | PREFILLED_SYRINGE | Freq: Once | INTRAMUSCULAR | Status: AC
  Administered 2022-06-25: 50 ug via INTRAVENOUS
  Filled 2022-06-25: qty 1
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
## 2022-06-25 NOTE — Progress Notes (Deleted)
Referring Physician:  Leim Fabry, MD [address] [place],  [place] [address]  Primary Physician:  Leim Fabry, MD  History of Present Illness: 06/25/2022*** Ms. Yomara Toothman has a history of obesity ***  Last seen by Dr. Myer Haff on 02/23/22 for LBP and right leg pain. 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 He discussed mircodisectomy at L4-L5 once she got down to 275 pounds.   She called the office on 06/25/22 with new complaints of weakness in her legs, feelings of not emptying her bladder, and 2 episodes 2 weeks prior where she had minimal warning that she was going to have BM.   She was advised to go to the ED on 06/25/22 for further evaluation.   She is here for follow up.         Duration: *** Location: *** Quality: *** Severity: ***  Precipitating: aggravated by *** Modifying factors: made better by *** Weakness: none Timing: *** Bowel/Bladder Dysfunction: none  Conservative measures:  Physical therapy: has participated in at [facility] without relief Multimodal medical therapy including regular antiinflammatories: naproxen, tylenol, gabapentin.  Injections: has had epidural steroid injections 11/18/21: Right L5 TF ESI by Dr. Cherylann Ratel (no relief) 10/12/21: Right L5 TF ESI by Dr. Cherylann Ratel (no relief)   Past Surgery:  denies  [place] Brittanee Ghazarian has ***no symptoms of cervical myelopathy.  The symptoms are causing a significant impact on the patient's life.   Review of Systems:  A 10 point review of systems is negative, except for the pertinent positives and negatives detailed in the HPI.  Past Medical History: Past Medical History:  Diagnosis Date   Asthma    Depression    Iron deficiency anemia    Sleep apnea    Vitamin D deficiency     Past Surgical History: Past Surgical History:  Procedure Laterality Date   LAPAROSCOPIC GASTRIC SLEEVE RESECTION  2016    Allergies: Allergies as of 07/06/2022 -  Review Complete 06/22/2022  Allergen Reaction Noted   Covid-19 (mrna) vaccine Other (See Comments) 07/28/2020   Lactose Other (See Comments), Diarrhea, and Nausea Only 03/05/2020   Other Anaphylaxis 08/11/2011   Measles and rubella virus vaccine  08/17/2016   Reglan [metoclopramide] Palpitations 08/17/2016    Medications: Outpatient Encounter Medications as of 07/06/2022  Medication Sig   acetaminophen (TYLENOL) 325 MG tablet Take 650 mg by mouth every 6 (six) hours as needed.   albuterol (PROVENTIL HFA;VENTOLIN HFA) 108 (90 Base) MCG/ACT inhaler Inhale into the lungs every 6 (six) hours as needed for wheezing or shortness of breath.   amitriptyline (ELAVIL) 50 MG tablet Take 50 mg by mouth at bedtime.   ARIPiprazole (ABILIFY) 5 MG tablet 10 mg 2 (two) times daily.   ascorbic acid (VITAMIN C) 500 MG tablet Take by mouth.   aspirin-acetaminophen-caffeine (EXCEDRIN MIGRAINE) 250-250-65 MG tablet Take by mouth.   azelastine (ASTELIN) 0.1 % nasal spray Place into the nose.   cholecalciferol (VITAMIN D) 1000 units tablet Take 1,000 Units by mouth daily.   clonazePAM (KLONOPIN) 0.5 MG tablet 0.5 mg. 1/2 tab two times per day and prn   doxycycline (VIBRAMYCIN) 100 MG capsule Take 1 capsule (100 mg total) by mouth 2 (two) times daily.   DULoxetine (CYMBALTA) 60 MG capsule Take 60 mg by mouth 2 (two) times daily.   ferrous sulfate 325 (65 FE) MG EC tablet Take 325 mg by mouth daily.   gabapentin (NEURONTIN) 300 MG capsule Take 300 mg by  mouth 3 (three) times daily. 1 in a.m., 1 at noon, 2 at night   Galcanezumab-gnlm (EMGALITY) 120 MG/ML SOAJ Inject into the skin.   hydrochlorothiazide (HYDRODIURIL) 25 MG tablet Take 1 tablet by mouth daily.   hydrochlorothiazide (HYDRODIURIL) 25 MG tablet Take 1 tablet by mouth daily.   HYDROcodone-acetaminophen (NORCO/VICODIN) 5-325 MG tablet Take 1 tablet by mouth every 4 (four) hours as needed.   Ibuprofen (ADVIL MIGRAINE PO) Take by mouth.    norethindrone-ethinyl estradiol (JUNEL FE,GILDESS FE,LOESTRIN FE) 1-20 MG-MCG tablet Take 1 tablet by mouth daily.   nortriptyline (PAMELOR) 25 MG capsule Take by mouth.   nortriptyline (PAMELOR) 50 MG capsule Take 50 mg by mouth at bedtime. 25 mg in a.m., 75 mg at p.m.   omeprazole (PRILOSEC) 20 MG capsule Take 20 mg by mouth daily.   ondansetron (ZOFRAN ODT) 8 MG disintegrating tablet Take 1 tablet (8 mg total) by mouth 2 (two) times daily.   predniSONE (DELTASONE) 10 MG tablet Take 1 tablet (10 mg total) by mouth daily. Day 1-3: take 4 tablets PO daily Day 4-6: take 3 tablets PO daily Day 7-9: take 2 tablets PO daily Day 10-12: take 1 tablet PO daily   Prenatal 28-0.8 MG TABS Take by mouth.   topiramate (TOPAMAX) 25 MG tablet TAKE 25 MG AT NIGHT FOR A WEEK THEN INCREASE TO 50 MG AT NIGHT FOR A WEEK THEN INCREASE TO 75 MG AT NIGHT   valsartan (DIOVAN) 160 MG tablet Take by mouth.   No facility-administered encounter medications on file as of 07/06/2022.    Social History: Social History   Tobacco Use   Smoking status: Never   Smokeless tobacco: Never  Vaping Use   Vaping Use: Never used  Substance Use Topics   Alcohol use: No    Comment: rarely, 2 drinks in past 2 months   Drug use: No    Family Medical History: Family History  Problem Relation Age of Onset   Hypertension Mother    Epilepsy Mother    Multiple sclerosis Mother    Depression Mother    Diabetes Mother    Diverticulitis Mother    Diabetes Father     Physical Examination: There were no vitals filed for this visit.    Awake, alert, oriented to person, place, and time.  Speech is clear and fluent. Fund of knowledge is appropriate.   Cranial Nerves: Pupils equal round and reactive to light.  Facial tone is symmetric.  Facial sensation is symmetric.  ROM of spine:  *** ROM of cervical spine *** pain *** ROM of lumbar spine *** pain  No abnormal lesions on exposed skin.   Strength: Side Biceps Triceps  Deltoid Interossei Grip Wrist Ext. Wrist Flex.  R 5 5 5 5 5 5 5   L 5 5 5 5 5 5 5    Side Iliopsoas Quads Hamstring PF DF EHL  R 5 5 5 5 5 5   L 5 5 5 5 5 5    Reflexes are ***2+ and symmetric at the biceps, triceps, brachioradialis, patella and achilles.   Hoffman's is absent.  Clonus is not present.   Bilateral upper and lower extremity sensation is intact to light touch.    No evidence of dysmetria noted.  Gait is normal.     Medical Decision Making  Imaging: ***  I have personally reviewed the images and agree with the above interpretation.  Assessment and Plan: Ms. Rieser is a pleasant 36 y.o. female with ***  Treatment options discussed with patient and following plan made:   - Order for physical therapy for *** spine ***. - Continue on current medications including ***. Reviewed proper dosing along with risks and benefits. Take and NSAIDs with food.      I spent a total of *** minutes in face-to-face and non-face-to-face activities related to this patient's care today including review of outside records, review of imaging, review of symptoms, physical exam, discussion of differential diagnosis, discussion of treatment options, and documentation.   Thank you for involving me in the care of this patient.   Drake Leach PA-C Dept. of Neurosurgery

## 2022-06-25 NOTE — Telephone Encounter (Signed)
I spoke with the patient. She reports 2 episodes of incontinence that she couldn't control her bowels 2 weeks ago. She states she did not know she had to go, she wasn't given a warning, she felt she had to go soon and then it was already too late. She now reports constipation, where her last bowel movement was 3 days ago. When asked about numbness, she states "a little bit, mostly vaginal". She denies urinary incontinence or retention, but reports some pain with urination and urinary frequency and isn't sure if she is fully emptying her bladder.  She also endorses new pressure in her back and new weakness in her legs since Tuesday causing her to use a walker since Wednesday. She went to the ER Wednesday night.   This is different than what is documented in the ER notes on Wednesday.  She reports all of the above is new since her last MRI.  Per discussion with Kennyth Arnold, I advised the patient to go to the ER. I also scheduled her a follow up appointment with Stacy on 07/06/22.

## 2022-06-25 NOTE — Telephone Encounter (Signed)
Appointment Request From: Debra Morales   With Provider: Venetia Night, MD [facility]]   Preferred Date Range: 06/25/2022 - 06/29/2022   Preferred Times: Any Time   Reason for visit: Office Visit   Comments: Emergency room visit back has gotten worse. Having trouble with bowel movements. In severe pain

## 2022-06-25 NOTE — Telephone Encounter (Signed)
Left message to return call 

## 2022-06-25 NOTE — ED Provider Notes (Signed)
[facility] Provider Note    Event Date/Time   First MD Initiated Contact with Patient 06/25/22 1950     (approximate)   History   Back Pain   HPI  Debra Morales is a 36 y.o. female who presents to the emergency department today at the advice of neurosurgery clinic who recommended she come for MRI given patient's complaints of low back pain and incontinence.  Patient states that she has history of known low back issues.  Was seen in the emergency department 2 days ago for worsening of low back pain.  She says she has been incontinent. Additionally she has been feeling some numbness in the genital area and legs. Had an MRI a number of months ago which showed disc protrusion.      Physical Exam   Triage Vital Signs: ED Triage Vitals  Enc Vitals Group     BP 06/25/22 1733 (!) 153/97     Pulse Rate 06/25/22 1733 (!) 106     Resp 06/25/22 1733 20     Temp 06/25/22 1733 97.9 F (36.6 C)     Temp Source 06/25/22 1733 Oral     SpO2 06/25/22 1733 98 %     Weight 06/25/22 1734 (!) 311 lb (141.1 kg)     Height 06/25/22 1734 5\' 4"  (1.626 m)     Head Circumference --      Peak Flow --      Pain Score 06/25/22 1733 10     Pain Loc --      Pain Edu? --      Excl. in [place]? --     Most recent vital signs: Vitals:   06/25/22 1733  BP: (!) 153/97  Pulse: (!) 106  Resp: 20  Temp: 97.9 F (36.6 C)  SpO2: 98%   General: Awake, alert, oriented. CV:  Good peripheral perfusion. Regular rate and rhythm. Resp:  Normal effort.  Abd:  No distention.     ED Results / Procedures / Treatments   Labs (all labs ordered are listed, but only abnormal results are displayed) Labs Reviewed  CBC WITH DIFFERENTIAL/PLATELET - Abnormal; Notable for the following components:      Result Value   WBC 16.3 (*)    Neutro Abs 10.8 (*)    Lymphs Abs 4.1 (*)    Monocytes Absolute 1.1 (*)    All other components within normal limits  BASIC METABOLIC PANEL -  Abnormal; Notable for the following components:   Potassium 3.4 (*)    All other components within normal limits  URINALYSIS, ROUTINE W REFLEX MICROSCOPIC - Abnormal; Notable for the following components:   Color, Urine YELLOW (*)    APPearance HAZY (*)    Specific Gravity, Urine 1.034 (*)    Ketones, ur 5 (*)    Leukocytes,Ua TRACE (*)    Bacteria, UA RARE (*)    All other components within normal limits  POC URINE PREG, ED     EKG  None   RADIOLOGY MRI pending   PROCEDURES:  Critical Care performed: No  Procedures   MEDICATIONS ORDERED IN ED: Medications - No data to display   IMPRESSION / MDM / [place] / ED COURSE  I reviewed the triage vital signs and the nursing notes.                              Differential diagnosis includes, but  is not limited to, disc herniation, UTI, cord compression, cauda equina.   Patient's presentation is most consistent with acute presentation with potential threat to life or bodily function.  Patient presented to the emergency department today at the advice of neurosurgery office because of concern for incontinence and low back pain. Also complaining of numbness in the genital area. Has history of disc protrusion in the lower spine. Will obtain MRI lumbar spine to evaluate for cord compression/cauda equina.    FINAL CLINICAL IMPRESSION(S) / ED DIAGNOSES   Low back pain    Note:  This document was prepared using Dragon voice recognition software and may include unintentional dictation errors.    Phineas Semen, MD 06/25/22 [phone]

## 2022-06-26 DIAGNOSIS — M5416 Radiculopathy, lumbar region: Secondary | ICD-10-CM

## 2022-06-26 MED ORDER — FENTANYL CITRATE PF 50 MCG/ML IJ SOSY
50.0000 ug | PREFILLED_SYRINGE | Freq: Once | INTRAMUSCULAR | Status: AC
  Administered 2022-06-26: 50 ug via INTRAVENOUS
  Filled 2022-06-26: qty 1

## 2022-06-26 MED ORDER — GABAPENTIN 300 MG PO CAPS: 300.0000 mg | ORAL_CAPSULE | Freq: Three times a day (TID) | ORAL | 0 refills | Status: AC

## 2022-06-26 MED ORDER — METHOCARBAMOL 500 MG PO TABS
500.0000 mg | ORAL_TABLET | Freq: Once | ORAL | Status: AC
  Administered 2022-06-26: 500 mg via ORAL
  Filled 2022-06-26: qty 1

## 2022-06-26 MED ORDER — LIDOCAINE 5 % EX PTCH
1.0000 | MEDICATED_PATCH | CUTANEOUS | Status: DC
  Administered 2022-06-26: 1 via TRANSDERMAL
  Filled 2022-06-26: qty 1

## 2022-06-26 MED ORDER — KETOROLAC TROMETHAMINE 30 MG/ML IJ SOLN
30.0000 mg | Freq: Once | INTRAMUSCULAR | Status: AC
  Administered 2022-06-26: 30 mg via INTRAVENOUS
  Filled 2022-06-26: qty 1

## 2022-06-26 MED ORDER — DEXAMETHASONE SODIUM PHOSPHATE 10 MG/ML IJ SOLN
10.0000 mg | Freq: Once | INTRAMUSCULAR | Status: AC
  Administered 2022-06-26: 10 mg via INTRAVENOUS
  Filled 2022-06-26: qty 1

## 2022-06-26 MED ORDER — NORTRIPTYLINE HCL 25 MG PO CAPS: 50.0000 mg | ORAL_CAPSULE | Freq: Every morning | ORAL | 0 refills | Status: AC

## 2022-06-26 MED ORDER — ACETAMINOPHEN 500 MG PO TABS
1000.0000 mg | ORAL_TABLET | Freq: Once | ORAL | Status: AC
  Administered 2022-06-26: 1000 mg via ORAL
  Filled 2022-06-26: qty 2

## 2022-06-26 NOTE — Discharge Instructions (Addendum)
Please use MiraLAX one half capful every hour until your first bowel movement.  Please do not take any MiraLAX after this for at least 24 hours.  You may use one half capful twice a day of MiraLAX in order to have 1 solid well-formed bowel movement per day.  You may increase or decrease this dosage as needed to obtain this 1 well-formed bowel movement.  Please make sure that you are drinking at least 8 ounces of water every hour during this initial bowel regimen. ?

## 2022-06-26 NOTE — ED Provider Notes (Signed)
Patient signed out to me awaiting MRI lumbar spine.  I reviewed these images with signs of a slight progression of disc extrusion on the right side from L3-S1.  As below, I consulted neurosurgery who does not think patient would necessitate urgent sutural intervention.  He is headed to the hospital later this morning and will evaluate the patient.  Patient is now signed out to the following provider to follow-up on these recommendations  Clinical Course as of 06/26/22 0809  Sat Jun 26, 2022  0148 I consult with Dr. Myer Haff, he pulled up and reviewed the MRI images that were just obtained.  Reports that this is a mild disc protrusion without indications for emergent or urgent surgical intervention.  Recommend steroids and he can come see the patient in the ED later this morning as he has multiple patient's in the hospital in the ER to see anyways. [DS]  0370 Updated patient. [DS]    Clinical Course User Index [DS] Delton Prairie, MD      Delton Prairie, MD 06/26/22 [phone]

## 2022-06-26 NOTE — Consult Note (Signed)
Consult requested by:  Dr. Tamala Julian  Consult requested for:  Lumbar disc herniation, rule out cauda equina syndrome  Primary Physician:  Gayland Curry, MD  History of Present Illness: 06/26/2022 Debra Morales is here today with a chief complaint of worsened right lower extremity pain.  She has previously been suffering from a lumbar radiculopathy as noted in my consultation quoted below.  She reports that over the past few days, she has had worsening pain down her right leg after she felt something give while going about her day-to-day activities.  She has lost approximately 50 pounds over the past 4 months and has been feeling much better until this time.  She was very concerned because she had some numbness around her private area as well as constipation.  Based on these concerns, she presented to the emergency department for evaluation.  She has been able to urinate.  She not had a bowel movement in 4 days.  02/23/2022 Debra Morales is here today with a chief complaint of low back pain traveling down the right leg, she is also complaining of numbness, tingling and weakness in the right leg.   Is been having pain for over a year.  She has sharp and aching pain as bad as 10 out of 10 down the back of her right leg.  She gets some pain in her anterolateral calf as well.  Standing, walking, lifting, and twisting make her pain worse.  Laying down and resting makes it better.  She can only walk 50 to 100 feet without pain.  When she stands in 1 position for more than a minute and a half she begins having severe pain down her right leg. Bowel/Bladder Dysfunction: none   Conservative measures:  Physical therapy: has participated in at [facility] without relief Multimodal medical therapy including regular antiinflammatories: naproxen, tylenol, gabapentin.  Injections: has had epidural steroid injections 11/18/21: Right L5 TF ESI by Dr. Holley Raring (no relief) 10/12/21: Right L5 TF  ESI by Dr. Holley Raring (no relief)   Past Surgery:  denies   Debra Morales has no symptoms of cervical myelopathy.   The symptoms are causing a significant impact on the patient's life.     Review of Systems:  A 10 point review of systems is negative, except for the pertinent positives and negatives detailed in the HPI.  Past Medical History: Past Medical History:  Diagnosis Date   Asthma    Depression    Iron deficiency anemia    Sleep apnea    Vitamin D deficiency     Past Surgical History: Past Surgical History:  Procedure Laterality Date   LAPAROSCOPIC GASTRIC SLEEVE RESECTION  2016    Allergies: Allergies as of 06/25/2022 - Review Complete 06/25/2022  Allergen Reaction Noted   Covid-19 (mrna) vaccine Other (See Comments) 07/28/2020   Lactose Other (See Comments), Diarrhea, and Nausea Only 03/05/2020   Other Anaphylaxis 08/11/2011   Measles and rubella virus vaccine  08/17/2016   Reglan [metoclopramide] Palpitations 08/17/2016    Medications: No outpatient medications have been marked as taking for the 06/25/22 encounter [facility]counter).    Social History: Social History   Tobacco Use   Smoking status: Never   Smokeless tobacco: Never  Vaping Use   Vaping Use: Never used  Substance Use Topics   Alcohol use: No    Comment: rarely, 2 drinks in past 2 months   Drug use: No    Family Medical History: Family History  Problem Relation Age of Onset   Hypertension Mother    Epilepsy Mother    Multiple sclerosis Mother    Depression Mother    Diabetes Mother    Diverticulitis Mother    Diabetes Father     Physical Examination: Vitals:   06/26/22 0115 06/26/22 0852  BP: 124/63 (!) 141/86  Pulse: 93 90  Resp: 16 16  Temp: 98 F (36.7 C) 97.9 F (36.6 C)  SpO2: 96% 92%    General: Patient is well developed, well nourished, calm, collected, and in no apparent distress. Attention to examination is appropriate.  Neck:   Supple.  Full  range of motion.  Respiratory: Patient is breathing without any difficulty.   NEUROLOGICAL:     Awake, alert, oriented to person, place, and time.  Speech is clear and fluent.   Cranial Nerves: Pupils equal round and reactive to light.  Facial tone is symmetric.  Facial sensation is symmetric. Shoulder shrug is symmetric.   Strength:  Moves all extremities well with apparent full strength.  Dedicated motor examination of the lower extremities showed no deficits.  Bilateral upper and lower extremity sensation is intact to light touch with exception of diminished sensation of the RLE in L5 distribution.    No evidence of dysmetria noted.  Gait is untested.     Medical Decision Making  Imaging: MRI L spine 06/26/2022 IMPRESSION: 1. Moderate sized right subarticular disc protrusion at L4-5, impinging upon the descending right L5 nerve root in the right lateral recess. This is stable to perhaps slightly increased in size from previous. 2. Small central to right subarticular disc protrusion at L5-S1, closely approximating and/or potentially irritating either of the descending S1 nerve roots, greater on the right. This is mildly increased in size from prior. 3. Moderate-sized central disc protrusion at L3-4 with resultant mild right greater than left lateral recess stenosis. This is also slightly increased in size from prior.     Electronically Signed   By: Rise Mu M.D.   On: 06/26/2022 00:15  I have personally reviewed the images and agree with the above interpretation.  Assessment and Plan: [name] is a pleasant 36 y.o. female with right L5 radiculopathy due to disc protrusion at L4-5.  She has no objective weakness on my examination.  She does not have cauda equina syndrome, and has no central stenosis that could cause cauda equina syndrome.  She is constipated.  I think it may help her feel better if she has a bowel movement.  Additionally, she may benefit  from increase gabapentin as well as a steroid taper.  I will contact her injection specialist Dr. Cherylann Ratel to try to arrange another injection.  I will see her back in clinic in approximately 1 month, she has been doing well with weight loss and is getting closer to the point at which we could consider elective spine surgery for her.  We previously set a goal at 275 pounds.    I have communicated my recommendations to the requesting physician and coordinated care to facilitate these recommendations.     Sherell Christoffel K. Myer Haff MD, [facility]surgery

## 2022-06-30 ENCOUNTER — Telehealth: Payer: Self-pay | Admitting: *Deleted

## 2022-06-30 ENCOUNTER — Other Ambulatory Visit: Payer: Self-pay | Admitting: Student in an Organized Health Care Education/Training Program

## 2022-06-30 DIAGNOSIS — M5416 Radiculopathy, lumbar region: Secondary | ICD-10-CM

## 2022-06-30 DIAGNOSIS — M5126 Other intervertebral disc displacement, lumbar region: Secondary | ICD-10-CM

## 2022-06-30 DIAGNOSIS — M5417 Radiculopathy, lumbosacral region: Secondary | ICD-10-CM

## 2022-06-30 NOTE — Telephone Encounter (Signed)
Returned our call, she does wish to proceed with an LESI, offered by Dr. Cherylann Ratel. Pre procedure instructions given.

## 2022-06-30 NOTE — Telephone Encounter (Signed)
Attempted to call patient to offer LESI. Message left.

## 2022-06-30 NOTE — Progress Notes (Signed)
Order placed for repeat right L5 transforaminal ESI as well as right S1 transforaminal ESI  Orders Placed This Encounter  Procedures   Lumbar Transforaminal Epidural    Standing Status:   Future    Standing Expiration Date:   09/29/2022    Scheduling Instructions:     Right L5, right S1 transforaminal ESI         Sedation: Patient's choice.     Timeframe: ASAP    Order Specific Question:   Where will this procedure be performed?    Answer:   [facility]

## 2022-06-30 NOTE — Telephone Encounter (Signed)
-----   Message from Edward Jolly, MD sent at 06/30/2022 10:05 AM EST ----- Regarding: please call pt and see if she is interested in L4-5 ESI, if so I can place order  ----- Message ----- From: Venetia Night, MD Sent: 06/26/2022   9:09 AM EST To: Edward Jolly, MD  [name]-  This patient presented to the emergency department with worsening discomfort after she felt something give in her back.  She is not yet at the point to consider elective surgery, though she is getting close.  I did set a goal weight of 275 pounds for her.  She is interested in considering another L4-5 epidural if you feel that is appropriate.  I am going to see her back in clinic in approximate 1 month as she will hopefully be nearing her weight goal to schedule surgery at that time.  Thanks, [employer]

## 2022-07-06 ENCOUNTER — Ambulatory Visit: Payer: 59 | Admitting: Orthopedic Surgery

## 2022-07-12 ENCOUNTER — Encounter: Payer: Self-pay | Admitting: Student in an Organized Health Care Education/Training Program

## 2022-07-12 ENCOUNTER — Ambulatory Visit
Payer: 59 | Attending: Student in an Organized Health Care Education/Training Program | Admitting: Student in an Organized Health Care Education/Training Program

## 2022-07-12 ENCOUNTER — Ambulatory Visit
Admission: RE | Admit: 2022-07-12 | Discharge: 2022-07-12 | Disposition: A | Discharge status: Home / Self Care | Payer: 59 | Source: Ambulatory Visit | Attending: Student in an Organized Health Care Education/Training Program | Admitting: Student in an Organized Health Care Education/Training Program

## 2022-07-12 DIAGNOSIS — Z79899 Other long term (current) drug therapy: Secondary | ICD-10-CM | POA: Insufficient documentation

## 2022-07-12 DIAGNOSIS — Z793 Long term (current) use of hormonal contraceptives: Secondary | ICD-10-CM | POA: Diagnosis not present

## 2022-07-12 DIAGNOSIS — M5417 Radiculopathy, lumbosacral region: Secondary | ICD-10-CM | POA: Insufficient documentation

## 2022-07-12 DIAGNOSIS — Z7952 Long term (current) use of systemic steroids: Secondary | ICD-10-CM | POA: Insufficient documentation

## 2022-07-12 DIAGNOSIS — M5416 Radiculopathy, lumbar region: Secondary | ICD-10-CM | POA: Diagnosis not present

## 2022-07-12 DIAGNOSIS — M5126 Other intervertebral disc displacement, lumbar region: Secondary | ICD-10-CM | POA: Diagnosis present

## 2022-07-12 DIAGNOSIS — Z9884 Bariatric surgery status: Secondary | ICD-10-CM | POA: Diagnosis not present

## 2022-07-12 MED ORDER — ROPIVACAINE HCL 2 MG/ML IJ SOLN
2.0000 mL | Freq: Once | INTRAMUSCULAR | Status: AC
  Administered 2022-07-12: 2 mL via EPIDURAL

## 2022-07-12 MED ORDER — SODIUM CHLORIDE (PF) 0.9 % IJ SOLN
INTRAMUSCULAR | Status: AC
  Filled 2022-07-12: qty 10

## 2022-07-12 MED ORDER — DEXAMETHASONE SODIUM PHOSPHATE 10 MG/ML IJ SOLN
20.0000 mg | Freq: Once | INTRAMUSCULAR | Status: AC
  Administered 2022-07-12: 20 mg
  Filled 2022-07-12: qty 2

## 2022-07-12 MED ORDER — ROPIVACAINE HCL 2 MG/ML IJ SOLN
INTRAMUSCULAR | Status: AC
  Filled 2022-07-12: qty 20

## 2022-07-12 MED ORDER — IOHEXOL 180 MG/ML  SOLN
INTRAMUSCULAR | Status: AC
  Filled 2022-07-12: qty 20

## 2022-07-12 MED ORDER — LIDOCAINE HCL 2 % IJ SOLN
20.0000 mL | Freq: Once | INTRAMUSCULAR | Status: AC
  Administered 2022-07-12: 100 mg

## 2022-07-12 MED ORDER — LIDOCAINE HCL (PF) 2 % IJ SOLN
INTRAMUSCULAR | Status: AC
  Filled 2022-07-12: qty 5

## 2022-07-12 MED ORDER — DEXAMETHASONE SODIUM PHOSPHATE 10 MG/ML IJ SOLN
INTRAMUSCULAR | Status: AC
  Filled 2022-07-12: qty 1

## 2022-07-12 MED ORDER — IOHEXOL 180 MG/ML  SOLN
10.0000 mL | Freq: Once | INTRAMUSCULAR | Status: AC
  Administered 2022-07-12: 10 mL via EPIDURAL

## 2022-07-12 MED ORDER — SODIUM CHLORIDE 0.9% FLUSH
2.0000 mL | Freq: Once | INTRAVENOUS | Status: AC
  Administered 2022-07-12: 2 mL

## 2022-07-12 NOTE — Progress Notes (Signed)
PROVIDER NOTE: Interpretation of information contained herein should be left to medically-trained personnel. Specific patient instructions are provided elsewhere under "Patient Instructions" section of medical record. This document was created in part using STT-dictation technology, any transcriptional errors that may result from this process are unintentional.  Patient: Debra Morales Type: Established DOB: Nov 02, 1985 MRN: [id number] PCP: Leim Fabry, MD  Service: Procedure DOS: 07/12/2022 Setting: Ambulatory Location: Ambulatory outpatient facility Delivery: Face-to-face Provider: Edward Jolly, MD Specialty: Interventional Pain Management Specialty designation: 09 Location: Outpatient facility Ref. Prov.: Edward Jolly, MD    Primary Reason for Visit: Interventional Pain Management Treatment. CC: Back Pain (low)   Procedure:           Type: Trans-Foraminal Epidural Steroid Injection (Lumbar) #2  Laterality: Right  Level: L5 & S1  Imaging: Fluoroscopic guidance Anesthesia: Local anesthesia (1-2% Lidocaine) DOS: 07/12/2022  Performed by: Edward Jolly, MD  Purpose: Diagnostic/Therapeutic Indications: Lumbar radicular pain severe enough to impact quality of life or function. 1. Lumbar radiculopathy   2. Protrusion of lumbar intervertebral disc   3. Lumbosacral radiculopathy at L5     NAS-11 Pain score:   Pre-procedure: 6 /10   Post-procedure: 5 /10     Position / Prep / Materials:  Position: Prone  Prep solution: DuraPrep (Iodine Povacrylex [0.7% available iodine] and Isopropyl Alcohol, 74% w/w) Prep Area: Entire Posterior Lumbosacral Area.  From the lower tip of the scapula down to the tailbone and from flank to flank. Materials:  Tray: Block Needle(s):  Type: Spinal  Gauge (G): 22  Length: 5-in  Qty: 1  Pre-op H&P Assessment:  Ms. Ferrara is a 37 y.o. (year old), female patient, seen today for interventional treatment. She  has a past surgical history  that includes Laparoscopic gastric sleeve resection (2016). Ms. Dickerman has a current medication list which includes the following prescription(s): acetaminophen, albuterol, aripiprazole, ascorbic acid, excedrin migraine, azelastine, cholecalciferol, clonazepam, doxycycline, duloxetine, ferrous sulfate, gabapentin, emgality, hydrochlorothiazide, ibuprofen, norethindrone-ethinyl estradiol-fe, nortriptyline, omeprazole, ondansetron, prenatal, topiramate, hydrochlorothiazide, hydrocodone-acetaminophen, nortriptyline, prednisone, and valsartan. Her primarily concern today is the Back Pain (low)  Initial Vital Signs:  Pulse/HCG Rate: 92ECG Heart Rate: 93 Temp: 97.6 F (36.4 C) Resp: 16 BP:  133/81 SpO2: 99 %  BMI: Estimated body mass index is 53.9 kg/m as calculated from the following:   Height as of this encounter: 5\' 4"  (1.626 m).   Weight as of this encounter: 314 lb (142.4 kg).  Risk Assessment: Allergies: Reviewed. She is allergic to covid-19 (mrna) vaccine, lactose, other, measles and rubella virus vaccine, and reglan [metoclopramide].  Allergy Precautions: None required Coagulopathies: Reviewed. None identified.  Blood-thinner therapy: None at this time Active Infection(s): Reviewed. None identified. Ms. Corney is afebrile  Site Confirmation: Ms. Harbach was asked to confirm the procedure and laterality before marking the site Procedure checklist: Completed Consent: Before the procedure and under the influence of no sedative(s), amnesic(s), or anxiolytics, the patient was informed of the treatment options, risks and possible complications. To fulfill our ethical and legal obligations, as recommended by the American Medical Association's Code of Ethics, I have informed the patient of my clinical impression; the nature and purpose of the treatment or procedure; the risks, benefits, and possible complications of the intervention; the alternatives, including doing nothing; the risk(s) and  benefit(s) of the alternative treatment(s) or procedure(s); and the risk(s) and benefit(s) of doing nothing. The patient was provided information about the general risks and possible complications associated with the procedure. These may include, but are  not limited to: failure to achieve desired goals, infection, bleeding, organ or nerve damage, allergic reactions, paralysis, and death. In addition, the patient was informed of those risks and complications associated to Spine-related procedures, such as failure to decrease pain; infection (i.e.: Meningitis, epidural or intraspinal abscess); bleeding (i.e.: epidural hematoma, subarachnoid hemorrhage, or any other type of intraspinal or peri-dural bleeding); organ or nerve damage (i.e.: Any type of peripheral nerve, nerve root, or spinal cord injury) with subsequent damage to sensory, motor, and/or autonomic systems, resulting in permanent pain, numbness, and/or weakness of one or several areas of the body; allergic reactions; (i.e.: anaphylactic reaction); and/or death. Furthermore, the patient was informed of those risks and complications associated with the medications. These include, but are not limited to: allergic reactions (i.e.: anaphylactic or anaphylactoid reaction(s)); adrenal axis suppression; blood sugar elevation that in diabetics may result in ketoacidosis or comma; water retention that in patients with history of congestive heart failure may result in shortness of breath, pulmonary edema, and decompensation with resultant heart failure; weight gain; swelling or edema; medication-induced neural toxicity; particulate matter embolism and blood vessel occlusion with resultant organ, and/or nervous system infarction; and/or aseptic necrosis of one or more joints. Finally, the patient was informed that Medicine is not an exact science; therefore, there is also the possibility of unforeseen or unpredictable risks and/or possible complications that may  result in a catastrophic outcome. The patient indicated having understood very clearly. We have given the patient no guarantees and we have made no promises. Enough time was given to the patient to ask questions, all of which were answered to the patient's satisfaction. Ms. Ugarte has indicated that she wanted to continue with the procedure. Attestation: I, the ordering provider, attest that I have discussed with the patient the benefits, risks, side-effects, alternatives, likelihood of achieving goals, and potential problems during recovery for the procedure that I have provided informed consent. Date  Time: 07/12/2022  8:24 AM  Pre-Procedure Preparation:  Monitoring: As per clinic protocol. Respiration, ETCO2, SpO2, BP, heart rate and rhythm monitor placed and checked for adequate function Safety Precautions: Patient was assessed for positional comfort and pressure points before starting the procedure. Time-out: I initiated and conducted the "Time-out" before starting the procedure, as per protocol. The patient was asked to participate by confirming the accuracy of the "Time Out" information. Verification of the correct person, site, and procedure were performed and confirmed by me, the nursing staff, and the patient. "Time-out" conducted as per Joint Commission's Universal Protocol (UP.01.01.01). Time: 0850  Description/Narrative of Procedure:          Target: The 6 o'clock position under the pedicle, on the affected side for right L5 and S1 Region: Posterolateral Lumbosacral Approach: Posterior Percutaneous Paravertebral approach.  Rationale (medical necessity): procedure needed and proper for the diagnosis and/or treatment of the patient's medical symptoms and needs. Procedural Technique Safety Precautions: Aspiration looking for blood return was conducted prior to all injections. At no point did we inject any substances, as a needle was being advanced. No attempts were made at seeking any  paresthesias. Safe injection practices and needle disposal techniques used. Medications properly checked for expiration dates. SDV (single dose vial) medications used. Description of the Procedure: Protocol guidelines were followed. The patient was placed in position over the procedure table. The target area was identified and the area prepped in the usual manner. Skin & deeper tissues infiltrated with local anesthetic. Appropriate amount of time allowed to pass for local anesthetics to  take effect. The procedure needles were then advanced to the target area. Proper needle placement secured. Negative aspiration confirmed. Solution injected in intermittent fashion, asking for systemic symptoms every 0.5cc of injectate. The needles were then removed and the area cleansed, making sure to leave some of the prepping solution back to take advantage of its long term bactericidal properties.  4 cc solution made of 1 cc of preservative-free saline, 1 cc of 0.2% ropivacaine, 2 cc of Decadron 10 mg/cc. 2 cc injected for right L5 and right S1 nerve   Vitals:   07/12/22 0848 07/12/22 0853 07/12/22 0858 07/12/22 0903  BP: (!) 147/95 (!) 157/93 (!) 157/94 (!) 161/96  Pulse:      Resp: (!) 27 (!) 22 (!) 25 17  Temp:      SpO2: 95% 98% 97% 99%  Weight:      Height:        Start Time: 0850 hrs. End Time: 0905 hrs.  Imaging Guidance (Spinal):          Type of Imaging Technique: Fluoroscopy Guidance (Spinal) Indication(s): Assistance in needle guidance and placement for procedures requiring needle placement in or near specific anatomical locations not easily accessible without such assistance. Exposure Time: Please see nurses notes. Contrast: Before injecting any contrast, we confirmed that the patient did not have an allergy to iodine, shellfish, or radiological contrast. Once satisfactory needle placement was completed at the desired level, radiological contrast was injected. Contrast injected under live  fluoroscopy. No contrast complications. See chart for type and volume of contrast used. Fluoroscopic Guidance: I was personally present during the use of fluoroscopy. "Tunnel Vision Technique" used to obtain the best possible view of the target area. Parallax error corrected before commencing the procedure. "Direction-depth-direction" technique used to introduce the needle under continuous pulsed fluoroscopy. Once target was reached, antero-posterior, oblique, and lateral fluoroscopic projection used confirm needle placement in all planes. Images permanently stored in EMR. Interpretation: I personally interpreted the imaging intraoperatively. Adequate needle placement confirmed in multiple planes. Appropriate spread of contrast into desired area was observed. No evidence of afferent or efferent intravascular uptake. No intrathecal or subarachnoid spread observed. Permanent images saved into the patient's record.   Post-operative Assessment:  Post-procedure Vital Signs:  Pulse/HCG Rate: 9291 Temp: 97.6 F (36.4 C) Resp: 17 BP:  (!) 161/96 SpO2: 99 %  EBL: None  Complications: No immediate post-treatment complications observed by team, or reported by patient.  Note: The patient tolerated the entire procedure well. A repeat set of vitals were taken after the procedure and the patient was kept under observation following institutional policy, for this type of procedure. Post-procedural neurological assessment was performed, showing return to baseline, prior to discharge. The patient was provided with post-procedure discharge instructions, including a section on how to identify potential problems. Should any problems arise concerning this procedure, the patient was given instructions to immediately contact us, at any time, without hesitation. In any case, we plan to contact the patient by telephone for a follow-up status report regarding this interventional procedure.  Comments:  No additional  relevant information.  5 out of 5 strength bilateral lower extremity: Plantar flexion, dorsiflexion, knee flexion, knee extension.   Plan of Care  Orders:  Orders Placed This Encounter  Procedures   DG PAIN CL[facility] C-ARM 1-60 MIN NO REPORT    Intraoperative interpretation by procedural physician at [facility].    Standing Status:   Standing    Number of Occurrences:   1  Order Specific Question:   Reason for exam:    Answer:   Assistance in needle guidance and placement for procedures requiring needle placement in or near specific anatomical locations not easily accessible without such assistance.    Medications ordered for procedure: Meds ordered this encounter  Medications   iohexol (OMNIPAQUE) 180 MG/ML injection 10 mL    Must be Myelogram-compatible. If not available, you may substitute with a water-soluble, non-ionic, hypoallergenic, myelogram-compatible radiological contrast medium.   lidocaine (XYLOCAINE) 2 % (with pres) injection 400 mg   sodium chloride flush (NS) 0.9 % injection 2 mL   ropivacaine (PF) 2 mg/mL (0.2%) (NAROPIN) injection 2 mL   dexamethasone (DECADRON) injection 20 mg   Medications administered: We administered iohexol, lidocaine, sodium chloride flush, ropivacaine (PF) 2 mg/mL (0.2%), and dexamethasone.  See the medical record for exact dosing, route, and time of administration.  Follow-up plan:   Return in about 4 weeks (around 08/09/2022) for PPE VV.       R [id number] 10/12/21, 11/18/21, R L5+ S1 TF ESI 07/12/22  Recent Visits No visits were found meeting these conditions. Showing recent visits within past 90 days and meeting all other requirements Today's Visits Date Type Provider Dept  07/12/22 Procedure visit Gillis Santa, MD [facility]  Showing today's visits and meeting all other requirements Future Appointments Date Type Provider Dept  08/09/22 Appointment Gillis Santa, MD [facility]  Showing future  appointments within next 90 days and meeting all other requirements  Disposition: Discharge home  Discharge (Date  Time): 07/12/2022; 0907 hrs.   Primary Care Physician: Gayland Curry, MD Location: [facility] Note by: Gillis Santa, MD Date: 07/12/2022; Time: 9:36 AM  Disclaimer:  Medicine is not an exact science. The only guarantee in medicine is that nothing is guaranteed. It is important to note that the decision to proceed with this intervention was based on the information collected from the patient. The Data and conclusions were drawn from the patient's questionnaire, the interview, and the physical examination. Because the information was provided in large part by the patient, it cannot be guaranteed that it has not been purposely or unconsciously manipulated. Every effort has been made to obtain as much relevant data as possible for this evaluation. It is important to note that the conclusions that lead to this procedure are derived in large part from the available data. Always take into account that the treatment will also be dependent on availability of resources and existing treatment guidelines, considered by other Pain Management Practitioners as being common knowledge and practice, at the time of the intervention. For Medico-Legal purposes, it is also important to point out that variation in procedural techniques and pharmacological choices are the acceptable norm. The indications, contraindications, technique, and results of the above procedure should only be interpreted and judged by a Board-Certified Interventional Pain Specialist with extensive familiarity and expertise in the same exact procedure and technique.

## 2022-07-12 NOTE — Patient Instructions (Signed)

## 2022-07-13 ENCOUNTER — Telehealth: Payer: Self-pay

## 2022-07-13 NOTE — Telephone Encounter (Signed)
Post procedure follow up.  Patient states she is doing well.   ?

## 2022-08-05 ENCOUNTER — Encounter: Payer: Self-pay | Admitting: Student in an Organized Health Care Education/Training Program

## 2022-08-05 ENCOUNTER — Telehealth: Payer: Self-pay

## 2022-08-05 NOTE — Telephone Encounter (Signed)
LM for patient to call back for pre virtual apointment questions./

## 2022-08-09 ENCOUNTER — Ambulatory Visit
Payer: 59 | Attending: Student in an Organized Health Care Education/Training Program | Admitting: Student in an Organized Health Care Education/Training Program

## 2022-08-09 ENCOUNTER — Encounter: Payer: Self-pay | Admitting: Neurosurgery

## 2022-08-09 ENCOUNTER — Encounter: Payer: Self-pay | Admitting: Student in an Organized Health Care Education/Training Program

## 2022-08-09 DIAGNOSIS — M5417 Radiculopathy, lumbosacral region: Secondary | ICD-10-CM | POA: Diagnosis not present

## 2022-08-09 DIAGNOSIS — M5126 Other intervertebral disc displacement, lumbar region: Secondary | ICD-10-CM | POA: Diagnosis not present

## 2022-08-09 DIAGNOSIS — M5416 Radiculopathy, lumbar region: Secondary | ICD-10-CM | POA: Diagnosis not present

## 2022-08-09 NOTE — Patient Instructions (Signed)
______________________________________________________________________  Preparing for your procedure  During your procedure appointment there will be: No Prescription Refills. No disability issues to discussed. No medication changes or discussions.  Instructions: Food intake: Avoid eating anything solid for at least 8 hours prior to your procedure. Clear liquid intake: You may take clear liquids such as water up to 2 hours prior to your procedure. (No carbonated drinks. No soda.) Transportation: Unless otherwise stated by your physician, bring a driver. Morning Medicines: Except for blood thinners, take all of your other morning medications with a sip of water. Make sure to take your heart and blood pressure medicines. If your blood pressure's lower number is above 100, the case will be rescheduled. Blood thinners: Make sure to stop your blood thinners as instructed.  If you take a blood thinner, but were not instructed to stop it, call our office [phone] and ask to talk to a nurse. Not stopping a blood thinner prior to certain procedures could lead to serious complications. Diabetics on insulin: Notify the staff so that you can be scheduled 1st case in the morning. If your diabetes requires high dose insulin, take only  of your normal insulin dose the morning of the procedure and notify the staff that you have done so. Preventing infections: Shower with an antibacterial soap the morning of your procedure.  Build-up your immune system: Take 1000 mg of Vitamin C with every meal (3 times a day) the day prior to your procedure. Antibiotics: Inform the nursing staff if you are taking any antibiotics or if you have any conditions that may require antibiotics prior to procedures. (Example: recent joint implants)   Pregnancy: If you are pregnant make sure to notify the nursing staff. Not doing so may result in injury to the fetus, including death.  Sickness: If you have a cold, fever, or any  active infections, call and cancel or reschedule your procedure. Receiving steroids while having an infection may result in complications. Arrival: You must be in the facility at least 30 minutes prior to your scheduled procedure. Tardiness: Your scheduled time is also the cutoff time. If you do not arrive at least 15 minutes prior to your procedure, you will be rescheduled.  Children: Do not bring any children with you. Make arrangements to keep them home. Dress appropriately: There is always a possibility that your clothing may get soiled. Avoid long dresses. Valuables: Do not bring any jewelry or valuables.  Reasons to call and reschedule or cancel your procedure: (Following these recommendations will minimize the risk of a serious complication.) Surgeries: Avoid having procedures within 2 weeks of any surgery. (Avoid for 2 weeks before or after any surgery). Flu Shots: Avoid having procedures within 2 weeks of a flu shots or . (Avoid for 2 weeks before or after immunizations). Barium: Avoid having a procedure within 7-10 days after having had a radiological study involving the use of radiological contrast. (Myelograms, Barium swallow or enema study). Heart attacks: Avoid any elective procedures or surgeries for the initial 6 months after a "Myocardial Infarction" (Heart Attack). Blood thinners: It is imperative that you stop these medications before procedures. Let us know if you if you take any blood thinner.  Infection: Avoid procedures during or within two weeks of an infection (including chest colds or gastrointestinal problems). Symptoms associated with infections include: Localized redness, fever, chills, night sweats or profuse sweating, burning sensation when voiding, cough, congestion, stuffiness, runny nose, sore throat, diarrhea, nausea, vomiting, cold or Flu symptoms, recent or   current infections. It is specially important if the infection is over the area that we intend to treat. Heart  and lung problems: Symptoms that may suggest an active cardiopulmonary problem include: cough, chest pain, breathing difficulties or shortness of breath, dizziness, ankle swelling, uncontrolled high or unusually low blood pressure, and/or palpitations. If you are experiencing any of these symptoms, cancel your procedure and contact your primary care physician for an evaluation.  Remember:  Regular Business hours are:  Monday to Thursday 8:00 AM to 4:00 PM  Provider's Schedule: Francisco Naveira, MD:  Procedure days: Tuesday and Thursday 7:30 AM to 4:00 PM  Bilal Lateef, MD:  Procedure days: Monday and Wednesday 7:30 AM to 4:00 PM  ______________________________________________________________________    

## 2022-08-09 NOTE — Progress Notes (Signed)
Patient: Debra Morales  Service Category: E/M  Provider: Gillis Santa, MD  DOB: Nov 11, 1985  DOS: 08/09/2022  Location: Office  MRN: [id number]  Setting: Ambulatory outpatient  Referring Provider: Gayland Curry, MD  Type: Established Patient  Specialty: Interventional Pain Management  PCP: Gayland Curry, MD  Location: Remote location  Delivery: TeleHealth     Virtual Encounter - Pain Management PROVIDER NOTE: Information contained herein reflects review and annotations entered in association with encounter. Interpretation of such information and data should be left to medically-trained personnel. Information provided to patient can be located elsewhere in the medical record under "Patient Instructions". Document created using STT-dictation technology, any transcriptional errors that may result from process are unintentional.    Contact & Pharmacy Preferred: [phone] Home: [phone] (home) Mobile: [phone] (mobile) E-mail: [email or web address]  [facility] #[id number] - [place], [place] - [address] Phone: [phone] Fax: [phone]   Pre-screening  Debra Morales offered "in-person" vs "virtual" encounter. She indicated preferring virtual for this encounter.   Reason COVID-19*  Social distancing based on CDC and AMA recommendations.   I contacted Debra Morales on 08/09/2022 via telephone.      I clearly identified myself as 15/11/2022, MD. I verified that I was speaking with the correct person using two identifiers (Name: Thuy Atilano, and date of birth: 1985/08/08).  Consent I sought verbal advanced consent from 20/01/1986 for virtual visit interactions. I informed Debra Morales of possible security and privacy concerns, risks, and limitations associated with providing "not-in-person" medical evaluation and management services. I also informed Debra Morales of the availability of "in-person" appointments.  Finally, I informed her that there would be a charge for the virtual visit and that she could be  personally, fully or partially, financially responsible for it. Debra Morales expressed understanding and agreed to proceed.   Historic Elements   Debra Morales is a 37 y.o. year old, female patient evaluated today after our last contact on 07/12/2022. Debra Morales  has a past medical history of Asthma, Depression, Iron deficiency anemia, Sleep apnea, and Vitamin D deficiency. She also  has a past surgical history that includes Laparoscopic gastric sleeve resection (2016). Debra Morales has a current medication list which includes the following prescription(s): acetaminophen, albuterol, aripiprazole, ascorbic acid, excedrin migraine, azelastine, cholecalciferol, clonazepam, doxycycline, duloxetine, ferrous sulfate, emgality, hydrochlorothiazide, ibuprofen, norethindrone-ethinyl estradiol-fe, nortriptyline, ondansetron, prenatal, topiramate, gabapentin, hydrochlorothiazide, hydrocodone-acetaminophen, nortriptyline, omeprazole, prednisone, and valsartan. She  reports that she has never smoked. She has never used smokeless tobacco. She reports that she does not drink alcohol and does not use drugs. Debra Morales is allergic to covid-19 (mrna) vaccine, lactose, other, measles and rubella virus vaccine, and reglan [metoclopramide].  Estimated body mass index is 53.9 kg/m as calculated from the following:   Height as of 07/12/22: 5\' 4"  (1.626 m).   Weight as of 07/12/22: 314 lb (142.4 kg).  HPI  Today, she is being contacted for a post-procedure assessment.   Post-procedure evaluation   Type: Trans-Foraminal Epidural Steroid Injection (Lumbar) #2  Laterality: Right  Level: L5 & S1  Imaging: Fluoroscopic guidance Anesthesia: Local anesthesia (1-2% Lidocaine) DOS: 07/12/2022  Performed by: 14/02/2023, MD  Purpose: Diagnostic/Therapeutic Indications: Lumbar radicular pain severe enough to impact  quality of life or function. 1. Lumbar radiculopathy   2. Protrusion of lumbar intervertebral disc   3. Lumbosacral radiculopathy at L5     NAS-11 Pain score:   Pre-procedure: 6 /10  Post-procedure: 5 /10     Effectiveness:  Initial hour after procedure: 50 %  Subsequent 4-6 hours post-procedure: 50 %  Analgesia past initial 6 hours: 50 %  Ongoing improvement:  Analgesic:  50% however pain is returning and patient would like to repeat as she states that she has not near her weight goal to have surgical decompression Function: Somewhat improved ROM: Somewhat improved   Laboratory Chemistry Profile   Renal Lab Results  Component Value Date   BUN 20 06/25/2022   CREATININE 0.70 06/25/2022   GFRAA >60 05/02/2013   GFRNONAA >60 06/25/2022    Hepatic Lab Results  Component Value Date   AST 19 05/14/2022   ALT 33 05/14/2022   ALBUMIN 3.5 05/14/2022   ALKPHOS 75 05/14/2022    Electrolytes Lab Results  Component Value Date   NA 139 06/25/2022   K 3.4 (L) 06/25/2022   CL 105 06/25/2022   CALCIUM 9.0 06/25/2022   MG 1.8 09/13/2012    Bone No results found for: "VD25OH", "VD125OH2TOT", "DT2671IW5", "YK9983JA2", "25OHVITD1", "25OHVITD2", "50NLZJQB3", "TESTOFREE", "TESTOSTERONE"  Inflammation (CRP: Acute Phase) (ESR: Chronic Phase) Lab Results  Component Value Date   ESRSEDRATE 43 (H) 05/14/2022         Note: Above Lab results reviewed.  Assessment  The primary encounter diagnosis was Lumbar radiculopathy. Diagnoses of Protrusion of lumbar intervertebral disc and Lumbosacral radiculopathy at L5 were also pertinent to this visit.  Plan of Care  Patient received analgesic and functional benefit after her previous right L5 and S1 transforaminal epidural steroid injection.  She states that the pain is returning.  We discussed considering a series of epidural steroid injections, up to 3.  She states that she is not near her weight goal which is required to be a candidate for  surgical decompression.  She states that the injections do help to manage her pain and allow her to function while she hopes to get to her [facility] for surgical decompression.   Orders:  Orders Placed This Encounter  Procedures   Lumbar Transforaminal Epidural    Standing Status:   Future    Standing Expiration Date:   11/07/2022    Scheduling Instructions:     Right L5-S1 transforaminal ESI    Order Specific Question:   Where will this procedure be performed?    Answer:   [facility]   Follow-up plan:   Return for Right L5, S1 transforaminal ESI.     R L5TFESI 10/12/21, 11/18/21, R L5+ S1 TF ESI 07/12/22   Recent Visits Date Type Provider Dept  07/12/22 Procedure visit Gillis Santa, MD [facility]  Showing recent visits within past 90 days and meeting all other requirements Today's Visits Date Type Provider Dept  08/09/22 Office Visit Gillis Santa, MD [facility]  Showing today's visits and meeting all other requirements Future Appointments No visits were found meeting these conditions. Showing future appointments within next 90 days and meeting all other requirements  I discussed the assessment and treatment plan with the patient. The patient was provided an opportunity to ask questions and all were answered. The patient agreed with the plan and demonstrated an understanding of the instructions.  Patient advised to call back or seek an in-person evaluation if the symptoms or condition worsens.  Duration of encounter: 37minutes.  Note by: Gillis Santa, MD Date: 08/09/2022; Time: 2:44 PM

## 2022-08-17 ENCOUNTER — Ambulatory Visit: Payer: 59 | Admitting: Neurosurgery

## 2022-08-25 ENCOUNTER — Ambulatory Visit
Admission: RE | Admit: 2022-08-25 | Discharge: 2022-08-25 | Disposition: A | Discharge status: Home / Self Care | Payer: 59 | Source: Ambulatory Visit | Attending: Student in an Organized Health Care Education/Training Program | Admitting: Student in an Organized Health Care Education/Training Program

## 2022-08-25 ENCOUNTER — Ambulatory Visit
Payer: 59 | Attending: Student in an Organized Health Care Education/Training Program | Admitting: Student in an Organized Health Care Education/Training Program

## 2022-08-25 ENCOUNTER — Encounter: Payer: Self-pay | Admitting: Student in an Organized Health Care Education/Training Program

## 2022-08-25 DIAGNOSIS — M5416 Radiculopathy, lumbar region: Secondary | ICD-10-CM

## 2022-08-25 DIAGNOSIS — M5417 Radiculopathy, lumbosacral region: Secondary | ICD-10-CM | POA: Diagnosis not present

## 2022-08-25 DIAGNOSIS — M5116 Intervertebral disc disorders with radiculopathy, lumbar region: Secondary | ICD-10-CM | POA: Insufficient documentation

## 2022-08-25 DIAGNOSIS — M5126 Other intervertebral disc displacement, lumbar region: Secondary | ICD-10-CM | POA: Insufficient documentation

## 2022-08-25 MED ORDER — SODIUM CHLORIDE 0.9% FLUSH
2.0000 mL | Freq: Once | INTRAVENOUS | Status: AC
  Administered 2022-08-25: 2 mL

## 2022-08-25 MED ORDER — DEXAMETHASONE SODIUM PHOSPHATE 10 MG/ML IJ SOLN
20.0000 mg | Freq: Once | INTRAMUSCULAR | Status: AC
  Administered 2022-08-25: 20 mg
  Filled 2022-08-25: qty 2

## 2022-08-25 MED ORDER — ROPIVACAINE HCL 2 MG/ML IJ SOLN
2.0000 mL | Freq: Once | INTRAMUSCULAR | Status: AC
  Administered 2022-08-25: 2 mL via EPIDURAL
  Filled 2022-08-25: qty 20

## 2022-08-25 MED ORDER — LIDOCAINE HCL 2 % IJ SOLN
20.0000 mL | Freq: Once | INTRAMUSCULAR | Status: AC
  Administered 2022-08-25: 100 mg
  Filled 2022-08-25: qty 40

## 2022-08-25 MED ORDER — IOHEXOL 180 MG/ML  SOLN
10.0000 mL | Freq: Once | INTRAMUSCULAR | Status: AC
  Administered 2022-08-25: 10 mL via EPIDURAL
  Filled 2022-08-25: qty 20

## 2022-08-25 NOTE — Progress Notes (Signed)
PROVIDER NOTE: Interpretation of information contained herein should be left to medically-trained personnel. Specific patient instructions are provided elsewhere under "Patient Instructions" section of medical record. This document was created in part using STT-dictation technology, any transcriptional errors that may result from this process are unintentional.  Patient: Debra Morales Type: Established DOB: March 05, 1986 MRN: [id number] PCP: Gayland Curry, MD  Service: Procedure DOS: 08/25/2022 Setting: Ambulatory Location: Ambulatory outpatient facility Delivery: Face-to-face Provider: Gillis Santa, MD Specialty: Interventional Pain Management Specialty designation: 09 Location: Outpatient facility Ref. Prov.: Gillis Santa, MD    Primary Reason for Visit: Interventional Pain Management Treatment. CC: Back Pain   Procedure:           Type: Trans-Foraminal Epidural Steroid Injection (Lumbar) #3  Laterality: Right  Level: L5 & S1  Imaging: Fluoroscopic guidance Anesthesia: Local anesthesia (1-2% Lidocaine) DOS: 08/25/2022  Performed by: Gillis Santa, MD  Purpose: Diagnostic/Therapeutic Indications: Lumbar radicular pain severe enough to impact quality of life or function. 1. Lumbar radiculopathy   2. Protrusion of lumbar intervertebral disc   3. Lumbosacral radiculopathy at L5     NAS-11 Pain score:   Pre-procedure: 4 /10   Post-procedure: 4 /10     Position / Prep / Materials:  Position: Prone  Prep solution: DuraPrep (Iodine Povacrylex [0.7% available iodine] and Isopropyl Alcohol, 74% w/w) Prep Area: Entire Posterior Lumbosacral Area.  From the lower tip of the scapula down to the tailbone and from flank to flank. Materials:  Tray: Block Needle(s):  Type: Spinal  Gauge (G): 22  Length: 5-in  Qty: 1  Pre-op H&P Assessment:  Debra Morales is a 37 y.o. (year old), female patient, seen today for interventional treatment. She  has a past surgical history that  includes Laparoscopic gastric sleeve resection (2016). Debra Morales has a current medication list which includes the following prescription(s): acetaminophen, albuterol, aripiprazole, ascorbic acid, excedrin migraine, azelastine, cholecalciferol, clonazepam, doxycycline, duloxetine, ferrous sulfate, emgality, hydrochlorothiazide, ibuprofen, norethindrone-ethinyl estradiol-fe, nortriptyline, ondansetron, prenatal, topiramate, gabapentin, hydrochlorothiazide, hydrocodone-acetaminophen, nortriptyline, omeprazole, prednisone, and valsartan. Her primarily concern today is the Back Pain  Initial Vital Signs:  Pulse/HCG Rate: 98ECG Heart Rate: 99 Temp: 99.9 F (37.7 C) (States she had a yeast infection) Resp: 16 BP:  (!) 146/99 SpO2: 99 %  BMI: Estimated body mass index is 55.09 kg/m as calculated from the following:   Height as of this encounter: 5' 3"$  (1.6 m).   Weight as of this encounter: 311 lb (141.1 kg).  Risk Assessment: Allergies: Reviewed. She is allergic to covid-19 (mrna) vaccine, lactose, other, measles and rubella virus vaccine, and reglan [metoclopramide].  Allergy Precautions: None required Coagulopathies: Reviewed. None identified.  Blood-thinner therapy: None at this time Active Infection(s): Reviewed. None identified. Debra Morales is afebrile  Site Confirmation: Debra Morales was asked to confirm the procedure and laterality before marking the site Procedure checklist: Completed Consent: Before the procedure and under the influence of no sedative(s), amnesic(s), or anxiolytics, the patient was informed of the treatment options, risks and possible complications. To fulfill our ethical and legal obligations, as recommended by the American Medical Association's Code of Ethics, I have informed the patient of my clinical impression; the nature and purpose of the treatment or procedure; the risks, benefits, and possible complications of the intervention; the alternatives, including doing  nothing; the risk(s) and benefit(s) of the alternative treatment(s) or procedure(s); and the risk(s) and benefit(s) of doing nothing. The patient was provided information about the general risks and possible complications associated with the procedure.  These may include, but are not limited to: failure to achieve desired goals, infection, bleeding, organ or nerve damage, allergic reactions, paralysis, and death. In addition, the patient was informed of those risks and complications associated to Spine-related procedures, such as failure to decrease pain; infection (i.e.: Meningitis, epidural or intraspinal abscess); bleeding (i.e.: epidural hematoma, subarachnoid hemorrhage, or any other type of intraspinal or peri-dural bleeding); organ or nerve damage (i.e.: Any type of peripheral nerve, nerve root, or spinal cord injury) with subsequent damage to sensory, motor, and/or autonomic systems, resulting in permanent pain, numbness, and/or weakness of one or several areas of the body; allergic reactions; (i.e.: anaphylactic reaction); and/or death. Furthermore, the patient was informed of those risks and complications associated with the medications. These include, but are not limited to: allergic reactions (i.e.: anaphylactic or anaphylactoid reaction(s)); adrenal axis suppression; blood sugar elevation that in diabetics may result in ketoacidosis or comma; water retention that in patients with history of congestive heart failure may result in shortness of breath, pulmonary edema, and decompensation with resultant heart failure; weight gain; swelling or edema; medication-induced neural toxicity; particulate matter embolism and blood vessel occlusion with resultant organ, and/or nervous system infarction; and/or aseptic necrosis of one or more joints. Finally, the patient was informed that Medicine is not an exact science; therefore, there is also the possibility of unforeseen or unpredictable risks and/or possible  complications that may result in a catastrophic outcome. The patient indicated having understood very clearly. We have given the patient no guarantees and we have made no promises. Enough time was given to the patient to ask questions, all of which were answered to the patient's satisfaction. Debra Morales has indicated that she wanted to continue with the procedure. Attestation: I, the ordering provider, attest that I have discussed with the patient the benefits, risks, side-effects, alternatives, likelihood of achieving goals, and potential problems during recovery for the procedure that I have provided informed consent. Date  Time: 08/25/2022 11:01 AM  Pre-Procedure Preparation:  Monitoring: As per clinic protocol. Respiration, ETCO2, SpO2, BP, heart rate and rhythm monitor placed and checked for adequate function Safety Precautions: Patient was assessed for positional comfort and pressure points before starting the procedure. Time-out: I initiated and conducted the "Time-out" before starting the procedure, as per protocol. The patient was asked to participate by confirming the accuracy of the "Time Out" information. Verification of the correct person, site, and procedure were performed and confirmed by me, the nursing staff, and the patient. "Time-out" conducted as per Joint Commission's Universal Protocol (UP.01.01.01). Time: 1151  Description/Narrative of Procedure:          Target: The 6 o'clock position under the pedicle, on the affected side for right L5 and S1 Region: Posterolateral Lumbosacral Approach: Posterior Percutaneous Paravertebral approach.  Rationale (medical necessity): procedure needed and proper for the diagnosis and/or treatment of the patient's medical symptoms and needs. Procedural Technique Safety Precautions: Aspiration looking for blood return was conducted prior to all injections. At no point did we inject any substances, as a needle was being advanced. No attempts were  made at seeking any paresthesias. Safe injection practices and needle disposal techniques used. Medications properly checked for expiration dates. SDV (single dose vial) medications used. Description of the Procedure: Protocol guidelines were followed. The patient was placed in position over the procedure table. The target area was identified and the area prepped in the usual manner. Skin & deeper tissues infiltrated with local anesthetic. Appropriate amount of time allowed to pass  for local anesthetics to take effect. The procedure needles were then advanced to the target area. Proper needle placement secured. Negative aspiration confirmed. Solution injected in intermittent fashion, asking for systemic symptoms every 0.5cc of injectate. The needles were then removed and the area cleansed, making sure to leave some of the prepping solution back to take advantage of its long term bactericidal properties.  4 cc solution made of 1 cc of preservative-free saline, 1 cc of 0.2% ropivacaine, 2 cc of Decadron 10 mg/cc. 2 cc injected for right L5 and right S1 nerve   Vitals:   08/25/22 1149 08/25/22 1154 08/25/22 1159 08/25/22 1202  BP: (!) 157/92 (!) 158/100 (!) 161/92 (!) 155/90  Pulse:      Resp: 18 16 18 18  $ Temp:      SpO2: 99% 98% 99% 98%  Weight:      Height:        Start Time: 1151 hrs. End Time: 1201 hrs.  Imaging Guidance (Spinal):          Type of Imaging Technique: Fluoroscopy Guidance (Spinal) Indication(s): Assistance in needle guidance and placement for procedures requiring needle placement in or near specific anatomical locations not easily accessible without such assistance. Exposure Time: Please see nurses notes. Contrast: Before injecting any contrast, we confirmed that the patient did not have an allergy to iodine, shellfish, or radiological contrast. Once satisfactory needle placement was completed at the desired level, radiological contrast was injected. Contrast injected under  live fluoroscopy. No contrast complications. See chart for type and volume of contrast used. Fluoroscopic Guidance: I was personally present during the use of fluoroscopy. "Tunnel Vision Technique" used to obtain the best possible view of the target area. Parallax error corrected before commencing the procedure. "Direction-depth-direction" technique used to introduce the needle under continuous pulsed fluoroscopy. Once target was reached, antero-posterior, oblique, and lateral fluoroscopic projection used confirm needle placement in all planes. Images permanently stored in EMR. Interpretation: I personally interpreted the imaging intraoperatively. Adequate needle placement confirmed in multiple planes. Appropriate spread of contrast into desired area was observed. No evidence of afferent or efferent intravascular uptake. No intrathecal or subarachnoid spread observed. Permanent images saved into the patient's record.   Post-operative Assessment:  Post-procedure Vital Signs:  Pulse/HCG Rate: 9895 Temp: 99.9 F (37.7 C) (States she had a yeast infection) Resp: 18 BP:  (!) 155/90 SpO2: 98 %  EBL: None  Complications: No immediate post-treatment complications observed by team, or reported by patient.  Note: The patient tolerated the entire procedure well. A repeat set of vitals were taken after the procedure and the patient was kept under observation following institutional policy, for this type of procedure. Post-procedural neurological assessment was performed, showing return to baseline, prior to discharge. The patient was provided with post-procedure discharge instructions, including a section on how to identify potential problems. Should any problems arise concerning this procedure, the patient was given instructions to immediately contact us, at any time, without hesitation. In any case, we plan to contact the patient by telephone for a follow-up status report regarding this interventional  procedure.  Comments:  No additional relevant information.  5 out of 5 strength bilateral lower extremity: Plantar flexion, dorsiflexion, knee flexion, knee extension.   Plan of Care  Orders:  Orders Placed This Encounter  Procedures   DG PAIN [facility]IN[facility] C-ARM 1-60 MIN NO REPORT    Intraoperative interpretation by procedural physician at [place].    Standing Status:   Standing  Number of Occurrences:   1    Order Specific Question:   Reason for exam:    Answer:   Assistance in needle guidance and placement for procedures requiring needle placement in or near specific anatomical locations not easily accessible without such assistance.    Medications ordered for procedure: Meds ordered this encounter  Medications   iohexol (OMNIPAQUE) 180 MG/ML injection 10 mL    Must be Myelogram-compatible. If not available, you may substitute with a water-soluble, non-ionic, hypoallergenic, myelogram-compatible radiological contrast medium.   lidocaine (XYLOCAINE) 2 % (with pres) injection 400 mg   sodium chloride flush (NS) 0.9 % injection 2 mL    This is for a two (2) level block. Use two (2) syringes and divide content in half.   ropivacaine (PF) 2 mg/mL (0.2%) (NAROPIN) injection 2 mL    This is for a two (2) level block. Use two (2) syringes and divide content in half.   dexamethasone (DECADRON) injection 20 mg    This is for a two (2) level block. Use two (2) syringes and divide content in half.   Medications administered: We administered iohexol, lidocaine, sodium chloride flush, ropivacaine (PF) 2 mg/mL (0.2%), and dexamethasone.  See the medical record for exact dosing, route, and time of administration.  Follow-up plan:   No follow-ups on file.       R L5TFESI 10/12/21, 11/18/21, R L5+ S1 TF ESI 07/12/22  Recent Visits Date Type Provider Dept  08/09/22 Office Visit Gillis Santa, MD [facility]  07/12/22 Procedure visit Gillis Santa, MD [facility]   Showing recent visits within past 90 days and meeting all other requirements Today's Visits Date Type Provider Dept  08/25/22 Procedure visit Gillis Santa, MD [facility]  Showing today's visits and meeting all other requirements Future Appointments Date Type Provider Dept  09/23/22 Appointment Gillis Santa, MD [facility]  Showing future appointments within next 90 days and meeting all other requirements  Disposition: Discharge home  Discharge (Date  Time): 08/25/2022; 1210 hrs.   Primary Care Physician: Gayland Curry, MD Location: [facility] Note by: Gillis Santa, MD Date: 08/25/2022; Time: 1:37 PM  Disclaimer:  Medicine is not an exact science. The only guarantee in medicine is that nothing is guaranteed. It is important to note that the decision to proceed with this intervention was based on the information collected from the patient. The Data and conclusions were drawn from the patient's questionnaire, the interview, and the physical examination. Because the information was provided in large part by the patient, it cannot be guaranteed that it has not been purposely or unconsciously manipulated. Every effort has been made to obtain as much relevant data as possible for this evaluation. It is important to note that the conclusions that lead to this procedure are derived in large part from the available data. Always take into account that the treatment will also be dependent on availability of resources and existing treatment guidelines, considered by other Pain Management Practitioners as being common knowledge and practice, at the time of the intervention. For Medico-Legal purposes, it is also important to point out that variation in procedural techniques and pharmacological choices are the acceptable norm. The indications, contraindications, technique, and results of the above procedure should only be interpreted and judged by a  Board-Certified Interventional Pain Specialist with extensive familiarity and expertise in the same exact procedure and technique.

## 2022-08-25 NOTE — Patient Instructions (Signed)
Pain Management Discharge Instructions  General Discharge Instructions :  If you need to reach your doctor call: Monday-Friday 8:00 am - 4:00 pm at [phone] or toll free [phone].  After clinic hours [phone] to have operator reach doctor.  Bring all of your medication bottles to all your appointments in the pain clinic.  To cancel or reschedule your appointment with Pain Management please remember to call 24 hours in advance to avoid a fee.  Refer to the educational materials which you have been given on: General Risks, I had my Procedure. Discharge Instructions, Post Sedation.  Post Procedure Instructions:  The drugs you were given will stay in your system until tomorrow, so for the next 24 hours you should not drive, make any legal decisions or drink any alcoholic beverages.  You may eat anything you prefer, but it is better to start with liquids then soups and crackers, and gradually work up to solid foods.  Please notify your doctor immediately if you have any unusual bleeding, trouble breathing or pain that is not related to your normal pain.  Depending on the type of procedure that was done, some parts of your body may feel week and/or numb.  This usually clears up by tonight or the next day.  Walk with the use of an assistive device or accompanied by an adult for the 24 hours.  You may use ice on the affected area for the first 24 hours.  Put ice in a Ziploc bag and cover with a towel and place against area 15 minutes on 15 minutes off.  You may switch to heat after 24 hours.Selective Nerve Root Block Patient Information  Description: Specific nerve roots exit the spinal canal and these nerves can be compressed and inflamed by a bulging disc and bone spurs.  By injecting steroids on the nerve root, we can potentially decrease the inflammation surrounding these nerves, which often leads to decreased pain.  Also, by injecting local anesthesia on the nerve root, this can  provide us helpful information to give to your referring doctor if it decreases your pain.  Selective nerve root blocks can be done along the spine from the neck to the low back depending on the location of your pain.   After numbing the skin with local anesthesia, a small needle is passed to the nerve root and the position of the needle is verified using x-ray pictures.  After the needle is in correct position, we then deposit the medication.  You may experience a pressure sensation while this is being done.  The entire block usually lasts less than 15 minutes.  Conditions that may be treated with selective nerve root blocks: Low back and leg pain Spinal stenosis Diagnostic block prior to potential surgery Neck and arm pain Post laminectomy syndrome  Preparation for the injection:  Do not eat any solid food or dairy products within 8 hours of your appointment. You may drink clear liquids up to 3 hours before an appointment.  Clear liquids include water, black coffee, juice or soda.  No milk or cream please. You may take your regular medications, including pain medications, with a sip of water before your appointment.  Diabetics should hold regular insulin (if taken separately) and take 1/2 normal NPH dose the morning of the procedure.  Carry some sugar containing items with you to your appointment. A driver must accompany you and be prepared to drive you home after your procedure. Bring all your current medications with you. An IV   may be inserted and sedation may be given at the discretion of the physician. A blood pressure cuff, EKG, and other monitors will often be applied during the procedure.  Some patients may need to have extra oxygen administered for a short period. You will be asked to provide medical information, including allergies, prior to the procedure.  We must know immediately if you are taking blood  Thinners (like Coumadin) or if you are allergic to IV iodine contrast  (dye).  Possible side-effects: All are usually temporary Bleeding from needle site Light headedness Numbness and tingling Decreased blood pressure Weakness in arms/legs Pressure sensation in back/neck Pain at injection site (several days)  Possible complications: All are extremely rare Infection Nerve injury Spinal headache (a headache wore with upright position)  Call if you experience: Fever/chills associated with headache or increased back/neck pain Headache worsened by an upright position New onset weakness or numbness of an extremity below the injection site Hives or difficulty breathing (go to the emergency room) Inflammation or drainage at the injection site(s) Severe back/neck pain greater than usual New symptoms which are concerning to you  Please note:  Although the local anesthetic injected can often make your back or neck feel good for several hours after the injection the pain will likely return.  It takes 3-5 days for steroids to work on the nerve root. You may not notice any pain relief for at least one week.  If effective, we will often do a series of 3 injections spaced 3-6 weeks apart to maximally decrease your pain.    If you have any questions, please call [phone] [facility] 

## 2022-08-25 NOTE — Progress Notes (Signed)
Safety precautions to be maintained throughout the outpatient stay will include: orient to surroundings, keep bed in low position, maintain call bell within reach at all times, provide assistance with transfer out of bed and ambulation.  

## 2022-08-26 ENCOUNTER — Telehealth: Payer: Self-pay

## 2022-08-26 NOTE — Telephone Encounter (Signed)
Post procedure follow up.  Patient states she is doing good.  

## 2022-09-23 ENCOUNTER — Ambulatory Visit
Payer: 59 | Attending: Student in an Organized Health Care Education/Training Program | Admitting: Student in an Organized Health Care Education/Training Program

## 2022-09-23 ENCOUNTER — Encounter: Payer: Self-pay | Admitting: Student in an Organized Health Care Education/Training Program

## 2022-09-23 DIAGNOSIS — M5126 Other intervertebral disc displacement, lumbar region: Secondary | ICD-10-CM | POA: Diagnosis not present

## 2022-09-23 DIAGNOSIS — M5417 Radiculopathy, lumbosacral region: Secondary | ICD-10-CM

## 2022-09-23 DIAGNOSIS — M5416 Radiculopathy, lumbar region: Secondary | ICD-10-CM | POA: Diagnosis not present

## 2022-09-23 NOTE — Progress Notes (Signed)
Patient: Debra Morales  Service Category: E/M  Provider: Gillis Santa, MD  DOB: March 06, 1986  DOS: 09/23/2022  Location: Office  MRN: [id number]  Setting: Ambulatory outpatient  Referring Provider: Gayland Curry, MD  Type: Established Patient  Specialty: Interventional Pain Management  PCP: Debra Curry, MD  Location: Remote location  Delivery: TeleHealth     Virtual Encounter - Pain Management PROVIDER NOTE: Information contained herein reflects review and annotations entered in association with encounter. Interpretation of such information and data should be left to medically-trained personnel. Information provided to patient can be located elsewhere in the medical record under "Patient Instructions". Document created using STT-dictation technology, any transcriptional errors that may result from process are unintentional.    Contact & Pharmacy Preferred: [phone] Home: [phone] (home) Mobile: [phone] (mobile) E-mail: [email or web address]  [id number] - [place], [place] - [address] [address] Phone: [phone] Fax: [phone]   Pre-screening  Debra Morales offered "in-person" vs "virtual" encounter. She indicated preferring virtual for this encounter.   Reason COVID-19*  Social distancing based on CDC and AMA recommendations.   I contacted Debra Morales on 09/23/2022 via telephone.      I clearly identified myself as 37/09/2022, MD. I verified that I was speaking with the correct person using two identifiers (Name: Debra Morales, and date of birth: [birth year]).  Consent I sought verbal advanced consent from 37/01/1986 for virtual visit interactions. I informed Debra Morales of possible security and privacy concerns, risks, and limitations associated with providing "not-in-person" medical evaluation and management services. I also informed Debra Morales of the availability of "in-person" appointments.  Finally, I informed her that there would be a charge for the virtual visit and that she could be  personally, fully or partially, financially responsible for it. Debra Morales expressed understanding and agreed to proceed.   Historic Elements   Debra Morales is a 37 y.o. year old, female patient evaluated today after our last contact on 08/25/2022. Debra Morales  has a past medical history of Asthma, Depression, Iron deficiency anemia, Sleep apnea, and Vitamin D deficiency. She also  has a past surgical history that includes Laparoscopic gastric sleeve resection (2016). Debra Morales has a current medication list which includes the following prescription(s): acetaminophen, albuterol, aripiprazole, ascorbic acid, excedrin migraine, azelastine, cholecalciferol, clonazepam, doxycycline, duloxetine, ferrous sulfate, emgality, hydrochlorothiazide, ibuprofen, norethindrone-ethinyl estradiol-fe, nortriptyline, ondansetron, prenatal, semaglutide-weight management, topiramate, gabapentin, hydrochlorothiazide, hydrocodone-acetaminophen, nortriptyline, omeprazole, prednisone, and valsartan. She  reports that she has never smoked. She has never used smokeless tobacco. She reports that she does not drink alcohol and does not use drugs. Debra Morales is allergic to covid-19 (mrna) vaccine, lactose, other, measles and rubella virus vaccine, and reglan [metoclopramide].  BMI: Estimated body mass index is 55.09 kg/m as calculated from the following:   Height as of 08/25/22: 5\' 3"  (1.6 m).   Weight as of 08/25/22: 311 lb (141.1 kg). Last encounter: 08/09/2022. Last procedure: 08/25/2022.  HPI  Today, she is being contacted for a post-procedure assessment.   Post-procedure evaluation   Type: Trans-Foraminal Epidural Steroid Injection (Lumbar) #3  Laterality: Right  Level: L5 & S1  Imaging: Fluoroscopic guidance Anesthesia: Local anesthesia (1-2% Lidocaine) DOS: 08/25/2022  Performed by: 09/07/2022,  MD  Purpose: Diagnostic/Therapeutic Indications: Lumbar radicular pain severe enough to impact quality of life or function. 1. Lumbar radiculopathy   2. Protrusion of lumbar intervertebral disc   3. Lumbosacral radiculopathy at L5  NAS-11 Pain score:   Pre-procedure: 4 /10   Post-procedure: 4 /10      Effectiveness:  Initial hour after procedure: 50 %  Subsequent 4-6 hours post-procedure: 50 % Analgesia past initial 6 hours: 75 % (currently)  Ongoing improvement:  Analgesic:  75% Function: Somewhat improved ROM: Somewhat improved   Laboratory Chemistry Profile   Renal Lab Results  Component Value Date   BUN 20 06/25/2022   CREATININE 0.70 06/25/2022   GFRAA >60 05/02/2013   GFRNONAA >60 06/25/2022    Hepatic Lab Results  Component Value Date   AST 19 05/14/2022   ALT 33 05/14/2022   ALBUMIN 3.5 05/14/2022   ALKPHOS 75 05/14/2022    Electrolytes Lab Results  Component Value Date   NA 139 06/25/2022   K 3.4 (L) 06/25/2022   CL 105 06/25/2022   CALCIUM 9.0 06/25/2022   MG 1.8 09/13/2012    Bone No results found for: "VD25OH", "VD125OH2TOT", "[id number]", "UK:060616", "25OHVITD1", "25OHVITD2", "[id number]", "TESTOFREE", "TESTOSTERONE"  Inflammation (CRP: Acute Phase) (ESR: Chronic Phase) Lab Results  Component Value Date   ESRSEDRATE 93 (H) 05/14/2022         Note: Above Lab results reviewed.  Imaging  DG PAIN CLINIC C-ARM 1-60 MIN NO REPORT Fluoro was used, but no Radiologist interpretation will be provided.  Please refer to "NOTES" tab for provider progress note.  Assessment  The primary encounter diagnosis was Lumbar radiculopathy. Diagnoses of Protrusion of lumbar intervertebral disc and Lumbosacral radiculopathy at L5 were also pertinent to this visit.  Plan of Care   Patient is doing well after her right L5 and S1 transforaminal ESI, endorses analgesic and functional benefit as detailed above.  I encouraged her to continue to monitor her  symptoms and follow-up as needed for repeat injection.   Follow-up plan:   Return if symptoms worsen or fail to improve.      R L5TFESI 10/12/21, 11/18/21, R L5+ S1 TF ESI 07/12/22, 08/25/22    Recent Visits Date Type Provider Dept  08/25/22 Procedure visit Debra Santa, MD [facility]  08/09/22 Office Visit Debra Santa, MD [facility]  07/12/22 Procedure visit Debra Santa, MD [facility]  Showing recent visits within past 90 days and meeting all other requirements Today's Visits Date Type Provider Dept  09/23/22 Office Visit Debra Santa, MD [facility]  Showing today's visits and meeting all other requirements Future Appointments No visits were found meeting these conditions. Showing future appointments within next 90 days and meeting all other requirements  I discussed the assessment and treatment plan with the patient. The patient was provided an opportunity to ask questions and all were answered. The patient agreed with the plan and demonstrated an understanding of the instructions.  Patient advised to call back or seek an in-person evaluation if the symptoms or condition worsens.  Duration of encounter: 20minutes.  Note by: Debra Santa, MD Date: 09/23/2022; Time: 2:36 PM

## 2022-09-28 ENCOUNTER — Ambulatory Visit: Payer: Self-pay | Admitting: Neurosurgery

## 2022-10-04 ENCOUNTER — Encounter: Payer: 59 | Attending: Family Medicine | Admitting: Dietician

## 2022-10-04 ENCOUNTER — Encounter: Payer: Self-pay | Admitting: Dietician

## 2022-10-04 VITALS — Ht 64.0 in | Wt 314.2 lb

## 2022-10-04 DIAGNOSIS — Z713 Dietary counseling and surveillance: Secondary | ICD-10-CM | POA: Insufficient documentation

## 2022-10-04 DIAGNOSIS — M549 Dorsalgia, unspecified: Secondary | ICD-10-CM | POA: Insufficient documentation

## 2022-10-04 DIAGNOSIS — I10 Essential (primary) hypertension: Secondary | ICD-10-CM

## 2022-10-04 DIAGNOSIS — Z6841 Body Mass Index (BMI) 40.0 and over, adult: Secondary | ICD-10-CM | POA: Diagnosis not present

## 2022-10-04 DIAGNOSIS — M5126 Other intervertebral disc displacement, lumbar region: Secondary | ICD-10-CM

## 2022-10-04 NOTE — Progress Notes (Signed)
Medical Nutrition Therapy: Visit start time: [id number]  end time: 1615  Assessment:  Diagnosis: obesity Medical history changes: improvement in back pain Psychosocial issues/ stress concerns: history of depression, anxiety  Medications, supplement changes: reviewed list in medical record   Current weight: 314.3lbs Height: 5'4" BMI: 53.93  Progress and evaluation:  Loss of 3lbs since 11.1.23; patient reports she managed food cravings well since November (other than briefly on holidays). Last 2 weeks have been tough, had some days of about 4000kcal intake, now getting back to about 1400kcal. Allowing one small treat daily rather than an entire day of splurging has helped so far. Patient reports starting Ozempic, then on hold due to insurance issues, and has now started [facility] as of 4 weeks ago She reports struggling to lose more than 5lbs over the past few months. Started walking short distances last week    Dietary Intake:  Usual eating pattern includes 3 meals and 0-1 snacks per day. Dining out frequency: 0-2 meals per week.  Breakfast: breakfast bowl <300kcal; cottage cheese with fruit; Balance Break snack pack Snack: none Lunch:  same or occ just fruit or veg or cottage cheese Snack: none recently Supper: pasta once a week or less; chicken + veg in pressure cooker; roast meat + veg Snack: none Beverages: water not as much recently; diet tea; occ diet soda  Physical activity: walking 10-15 minutes 4x a week  Intervention:   Nutrition Care Education:  Basic nutrition: reviewed general nutrition guidelines    Weight control: reviewed progress since previous visit; emotional eating/ food cravings, having nonfood measures to manage emotions; dealing with weight loss plateaus, avoiding guilt feelings; discussed long term goals for intake per patient question  Other Intervention Notes: Commended patient for ongoing diligence in tracking food intake and developing concrete plans to manage  intake. Goal is to continue with 1400kcal pattern and to continue to gradually increase physical activity as tolerated.  Scheduled next visit for 04/2023 per patient request   Nutritional Diagnosis:  [place]-3.3 Overweight/obesity As related to history of obesity, history of bariatric surgery, limited activity due to back pain.  As evidenced by patient with current BMI of 53.93, following low fat and low sugar eating pattern to promote ongoing weight loss.     Learner/ who was taught:  Patient  Spouse/ partner  Level of understanding: Verbalizes/ demonstrates competency  Demonstrated degree of understanding via:   Teach back Learning barriers: None   Willingness to learn/ readiness for change: Eager, change in progress   Monitoring and Evaluation:  Dietary intake, exercise, and body weight      follow up:  04/11/23 at 3:30pm

## 2022-11-23 ENCOUNTER — Ambulatory Visit: Payer: 59 | Admitting: Neurosurgery

## 2023-04-11 ENCOUNTER — Encounter: Payer: 59 | Attending: Family Medicine | Admitting: Dietician

## 2023-04-11 ENCOUNTER — Encounter: Payer: Self-pay | Admitting: Dietician

## 2023-04-11 VITALS — Ht 64.0 in | Wt 319.8 lb

## 2023-04-11 DIAGNOSIS — Z713 Dietary counseling and surveillance: Secondary | ICD-10-CM | POA: Diagnosis not present

## 2023-04-11 DIAGNOSIS — Z6841 Body Mass Index (BMI) 40.0 and over, adult: Secondary | ICD-10-CM | POA: Insufficient documentation

## 2023-04-11 DIAGNOSIS — E669 Obesity, unspecified: Secondary | ICD-10-CM | POA: Insufficient documentation

## 2023-04-11 DIAGNOSIS — M5126 Other intervertebral disc displacement, lumbar region: Secondary | ICD-10-CM

## 2023-04-11 DIAGNOSIS — I10 Essential (primary) hypertension: Secondary | ICD-10-CM

## 2023-04-11 NOTE — Patient Instructions (Signed)
Continue tracking food intake either using app, or finding a suitable alternative such as putting star(s) on a calendar each day a goal is met.  Resume treadmill walking most days Reduce portions of starchy foods and/or meats to better control calories.  Add a snack or light meal in order to eat at least every 5 hours during the day. This can help control hunger at other meals.

## 2023-04-11 NOTE — Progress Notes (Signed)
Medical Nutrition Therapy: Visit start time: 1530  end time: 1600  Assessment:  Diagnosis: obesity Medical history changes: no changes Psychosocial issues/ stress concerns: increased stress in recent months  Medications, supplement changes: reconciled list in medical record   Current weight: 319.8lbs Height: 5'4" BMI: 54.89  Progress and evaluation:  Weight increase of 5.5lbs since previous visit on 10/04/22. Patient reports increase of about 15lbs, and recently resuming weight loss. Patient reports about 3 months of not following weight loss eating pattern, due to stressful events.  She feels tracking intake kept food choices in line, and when she stopped, eating habits worsened -- larger portions, high calorie choices.  Financial situation is tighter at this time. Has been increasing time on treadmill until about 2 weeks ago; has not been using wheelchair for mobility     Dietary Intake:  Usual eating pattern includes 2 meals and 0 snacks per day. Dining out frequency: 0-2 meals per week.  Breakfast: missed today; usually brunch -- leftovers often with veg; beef curry with rice, potatoes; sm amt pasta (spaghetti); cottage cheese and fruit; occ [employer] breakfast bowl; occ plain rice Snack: none Lunch: same as breakfast Snack: only on "free" day; usually none Supper: often beef + veg + sm portion starch Snack: none Beverages: diet soda, sugar free lemonade; occasional diet tea, occasional water trying to increase  Physical activity: was walking on treadmill 20 minutes daily, until 2.5 weeks ago due to fatigue and then GI virus.   Intervention:   Nutrition Care Education:  Basic nutrition: reviewed appropriate nutrient balance; appropriate meal and snack schedule; general nutrition guidelines    Weight control: reviewed progress since previous visit; portion control; tracking food intake; incentive/ reward system/ focus on progress and not failures -- to increase adherence to  healthy habits   Other Intervention Notes: Patient is resuming healthier eating pattern after several stressful months.  Updated nutrition goals with direction from patient Scheduled next visit in 6 months per patient request.   Nutritional Diagnosis:  [place]-3.3 Overweight/obesity As related to history of obesity, history of bariatric surgery, limited activity due to back pain.  As evidenced by patient with current BMI of 54.89, resuming low fat and low sugar eating pattern to promote ongoing weight loss.    Education Materials given:  Visit summary with goals/ instructions   Learner/ who was taught:  Patient  Spouse/ partner  Level of understanding: Verbalizes/ demonstrates competency  Demonstrated degree of understanding via:   Teach back Learning barriers: None  Willingness to learn/ readiness for change: Eager, change in progress  Monitoring and Evaluation:  Dietary intake, exercise, and body weight      follow up:  10/12/23 at 3:30pm

## 2023-10-12 ENCOUNTER — Ambulatory Visit: Payer: 59 | Admitting: Dietician

## 2024-07-17 ENCOUNTER — Ambulatory Visit
Payer: Self-pay | Attending: Student in an Organized Health Care Education/Training Program | Admitting: Student in an Organized Health Care Education/Training Program

## 2024-07-17 ENCOUNTER — Encounter: Payer: Self-pay | Admitting: Student in an Organized Health Care Education/Training Program

## 2024-07-17 VITALS — BP 146/78 | HR 92 | Temp 99.0°F | Resp 18 | Ht 64.0 in | Wt 319.0 lb

## 2024-07-17 DIAGNOSIS — M5126 Other intervertebral disc displacement, lumbar region: Secondary | ICD-10-CM | POA: Insufficient documentation

## 2024-07-17 DIAGNOSIS — M5416 Radiculopathy, lumbar region: Secondary | ICD-10-CM | POA: Insufficient documentation

## 2024-07-17 DIAGNOSIS — M5417 Radiculopathy, lumbosacral region: Secondary | ICD-10-CM | POA: Diagnosis not present

## 2024-07-17 NOTE — Progress Notes (Signed)
 PROVIDER NOTE: Interpretation of information contained herein should be left to medically-trained personnel. Specific patient instructions are provided elsewhere under Patient Instructions section of medical record. This document was created in part using AI and STT-dictation technology, any transcriptional errors that may result from this process are unintentional.  Patient: Debra Morales  Service: E/M   PCP: Jyl Railing, MD  DOB: 1985/09/23  DOS: 07/17/2024  Provider: Wallie Sherry, MD  MRN: [id number]  Delivery: Face-to-face  Specialty: Interventional Pain Management  Type: Established Patient  Setting: Ambulatory outpatient facility  Specialty designation: 09  Referring Prov.: Jyl Railing, MD  Location: Outpatient office facility       History of present illness (HPI) Debra Morales, a 39 y.o. year old female, is here today because of her Lumbar radiculopathy [M54.16]. Debra Morales primary complain today is Back Pain (low)  Pertinent problems: Debra Morales has Lumbar radiculopathy; Protrusion of lumbar intervertebral disc; and Lumbosacral radiculopathy at L5 (right) on their pertinent problem list.  Pain Assessment: Severity of Chronic pain is reported as a 7 /10. Location: Back Lower/right leg to ankle on the side. Onset: More than a month ago. Quality: Dull, Throbbing, Sharp. Timing: Constant. Modifying factor(s): nothing. Vitals:  height is 5' 4 (1.626 m) and weight is 319 lb (144.7 kg) (abnormal). Her temperature is 99 F (37.2 C). Her blood pressure is 146/78 (abnormal) and her pulse is 92. Her respiration is 18 and oxygen saturation is 97%.  BMI: Estimated body mass index is 54.76 kg/m as calculated from the following:   Height as of this encounter: 5' 4 (1.626 m).   Weight as of this encounter: 319 lb (144.7 kg).  Last encounter: Patient previously seen by me 08/25/2022 for right L5 and right S1 transforaminal epidural steroid injection helped with  her symptoms at that time.  Reason for encounter:   She has experienced chronic right-sided pain that initially improved with weight loss but has returned following weight gain. The pain began after a bowling incident and has persisted, worsening over time. It is located in the same area and has the same quality as before, with additional numbness and tingling in her leg when standing for prolonged periods.  In mid-2025, she was diagnosed with fatty liver disease, which led to the discontinuation of her Zepbound prescription and keto diet due to elevated liver enzymes, which were in the 500s. This change in treatment coincided with weight gain and the return of her pain. She is currently working with weight management and has started a new appetite suppressant to aid in weight loss.  She has a history of sleep apnea, diagnosed in 2012, and is exploring treatment options that may address both her weight and sleep apnea. She has not been diagnosed with diabetes, despite recent testing.  Her current medications include ibuprofen, which is ineffective for her pain. She cannot take narcotics due to her use of clozapine. She has previously been prescribed prednisone  and Dermafix for pain management, with limited success. She is frustrated with her current pain management, noting that she has exhausted available options and is unable to use her treadmill due to pain. She is seeking further intervention to manage her symptoms and improve her quality of life.   ROS  Constitutional: Denies any fever or chills Gastrointestinal: No reported hemesis, hematochezia, vomiting, or acute GI distress Musculoskeletal: Radiating right leg pain Neurological: No reported episodes of acute onset apraxia, aphasia, dysarthria, agnosia, amnesia, paralysis, loss of coordination, or loss of consciousness  Medication Review  DULoxetine, Galcanezumab-gnlm, Ibuprofen, Multi-Vitamin, Norethindrone Acetate-Ethinyl Estradiol,  acetaminophen , albuterol, ascorbic acid, aspirin-acetaminophen -caffeine, azelastine, cariprazine, cholecalciferol, clonazePAM, ferrous sulfate, fexofenadine, gabapentin , hydrochlorothiazide, naltrexone, naproxen, nortriptyline , omeprazole, ondansetron , potassium chloride, predniSONE , tirzepatide, traZODone, and valsartan  History Review  Allergy: Debra Morales is allergic to covid-19 (mrna) vaccine, lactose, other, measles and rubella virus vaccine, and reglan [metoclopramide]. Drug: Debra Morales  reports no history of drug use. Alcohol:  reports no history of alcohol use. Tobacco:  reports that she has never smoked. She has never used smokeless tobacco. Social: Debra Morales  reports that she has never smoked. She has never used smokeless tobacco. She reports that she does not drink alcohol and does not use drugs. Medical:  has a past medical history of Asthma, Depression, Iron deficiency anemia, Sleep apnea, and Vitamin D deficiency. Surgical: Debra Morales  has a past surgical history that includes Laparoscopic gastric sleeve resection (2016). Family: family history includes Depression in her mother; Diabetes in her father and mother; Diverticulitis in her mother; Epilepsy in her mother; Hypertension in her mother; Multiple sclerosis in her mother.  Laboratory Chemistry Profile   Renal Lab Results  Component Value Date   BUN 20 06/25/2022   CREATININE 0.70 06/25/2022   GFRAA >60 05/02/2013   GFRNONAA >60 06/25/2022    Hepatic Lab Results  Component Value Date   AST 19 05/14/2022   ALT 33 05/14/2022   ALBUMIN 3.5 05/14/2022   ALKPHOS 75 05/14/2022    Electrolytes Lab Results  Component Value Date   NA 139 06/25/2022   K 3.4 (L) 06/25/2022   CL 105 06/25/2022   CALCIUM 9.0 06/25/2022   MG 1.8 09/13/2012    Bone No results found for: VD25OH, VD125OH2TOT, CI6874NY7, CI7874NY7, 25OHVITD1, 25OHVITD2, 25OHVITD3, TESTOFREE, TESTOSTERONE  Inflammation (CRP: Acute Phase)  (ESR: Chronic Phase) Lab Results  Component Value Date   ESRSEDRATE 47 (H) 05/14/2022         Note: Above Lab results reviewed.  Recent Imaging Review  CLINICAL DATA:  Initial evaluation for low back pain, incontinence.   EXAM: MRI LUMBAR SPINE WITHOUT CONTRAST   TECHNIQUE: Multiplanar, multisequence MR imaging of the lumbar spine was performed. No intravenous contrast was administered.   COMPARISON:  Previous MRI from 01/12/2022.   FINDINGS: Segmentation: Standard. Lowest well-formed disc space labeled the L5-S1 level.   Alignment: Mild levoscoliosis. Trace degenerative retrolisthesis of L5 on S1. Appearance is stable from prior.   Vertebrae: Vertebral body height maintained without acute or chronic fracture. Diffuse loss of normal bone marrow signal, nonspecific but can be seen with anemia, smoking, obesity, and infiltrative/myelofibrotic marrow processes. No worrisome osseous lesions. Minimal discogenic reactive endplate changes present about the L4-5 and L5-S1 interspaces. No abnormal marrow edema.   Conus medullaris and cauda equina: Conus extends to the T12 level. Conus and cauda equina appear normal.   Paraspinal and other soft tissues: Unremarkable.   Disc levels:   L1-2:  Unremarkable.   L2-3:  Unremarkable.   L3-4: Disc desiccation. Moderate-sized central disc protrusion indents the ventral thecal sac (series 12, image 24). This is slightly increased in size from prior. Resultant mild right greater than left lateral recess stenosis. Central canal remains patent. No significant foraminal stenosis.   L4-5: Disc desiccation with diffuse disc bulge and reactive endplate spurring. Moderate sized right subarticular disc protrusion impinges upon the descending right L5 nerve root in the right lateral recess. This is stable to perhaps slightly increased in size from prior. Resultant mild canal with moderate  right lateral recess stenosis. Foramina remain  patent.   L5-S1: Disc desiccation with diffuse disc bulge. Superimposed small central to right subarticular disc protrusion with annular fissure (series 12, image 33). Protruding disc closely approximates and/or contacts both of the descending S1 nerve roots, greater on the right. This is increased in size from prior. Epidural lipomatosis. Mild bilateral lateral recess stenosis. No other significant spinal stenosis. Foramina remain patent.   IMPRESSION: 1. Moderate sized right subarticular disc protrusion at L4-5, impinging upon the descending right L5 nerve root in the right lateral recess. This is stable to perhaps slightly increased in size from previous. 2. Small central to right subarticular disc protrusion at L5-S1, closely approximating and/or potentially irritating either of the descending S1 nerve roots, greater on the right. This is mildly increased in size from prior. 3. Moderate-sized central disc protrusion at L3-4 with resultant mild right greater than left lateral recess stenosis. This is also slightly increased in size from prior.     Electronically Signed   By: Morene Hoard M.D.   On: 06/26/2022 00:15 Note: Reviewed        Physical Exam  Vitals: BP (!) 146/78   Pulse 92   Temp 99 F (37.2 C)   Resp 18   Ht 5' 4 (1.626 m)   Wt (!) 319 lb (144.7 kg)   SpO2 97%   BMI 54.76 kg/m  BMI: Estimated body mass index is 54.76 kg/m as calculated from the following:   Height as of this encounter: 5' 4 (1.626 m).   Weight as of this encounter: 319 lb (144.7 kg). Ideal: Ideal body weight: 54.7 kg (120 lb 9.5 oz) Adjusted ideal body weight: 90.7 kg (199 lb 15.3 oz) General appearance: Well nourished, well developed, and well hydrated. In no apparent acute distress Mental status: Alert, oriented x 3 (person, place, & time)       Respiratory: No evidence of acute respiratory distress Eyes: PERLA  Lumbar Spine Area Exam  Skin & Axial Inspection: No masses,  redness, or swelling Alignment: Symmetrical Functional ROM: Pain restricted ROM       Stability: No instability detected Muscle Tone/Strength: Functionally intact. No obvious neuro-muscular anomalies detected. Sensory (Neurological): Dermatomal pain pattern right L5 and S1   Gait & Posture Assessment  Ambulation: Patient came in today in a wheel chair Gait: Modified gait pattern (slower gait speed, wider stride width, and longer stance duration) associated with morbid obesity Posture: Difficulty standing up straight, due to pain  Lower Extremity Exam      Side: Right lower extremity   Side: Left lower extremity  Stability: No instability observed           Stability: No instability observed          Skin & Extremity Inspection: Skin color, temperature, and hair growth are WNL. No peripheral edema or cyanosis. No masses, redness, swelling, asymmetry, or associated skin lesions. No contractures.   Skin & Extremity Inspection: Skin color, temperature, and hair growth are WNL. No peripheral edema or cyanosis. No masses, redness, swelling, asymmetry, or associated skin lesions. No contractures.  Functional ROM: Pain restricted ROM for hip and knee joints           Functional ROM: Unrestricted ROM                  Muscle Tone/Strength: Functionally intact. No obvious neuro-muscular anomalies detected.   Muscle Tone/Strength: Functionally intact. No obvious neuro-muscular anomalies detected.  Sensory (Neurological): Dermatomal pain  pattern         Sensory (Neurological): Unimpaired        DTR: Patellar: 0: absent Achilles: deferred today Plantar: deferred today   DTR: Patellar: 0: absent Achilles: deferred today Plantar: deferred today  Palpation: No palpable anomalies   Palpation: No palpable anomalies     Assessment   Diagnosis  1. Lumbar radiculopathy   2. Protrusion of lumbar intervertebral disc   3. Lumbosacral radiculopathy at L5      Updated Problems: No problems  updated.  Plan of Care  Repeat right L5 and right S1 lumbar transforaminal epidural steroid injection for lumbar radicular pain flare.  Previously done 08/25/2022 that provided greater than 80% pain relief for over a year.  Risks and benefits reviewed and patient would like to proceed. Orders:  Orders Placed This Encounter  Procedures   Lumbar Transforaminal Epidural    Standing Status:   Future    Expiration Date:   10/15/2024    Scheduling Instructions:     Laterality:Right L5 and S1          Sedation: Patient's choice.     Timeframe: As soon as schedule allows.    Where will this procedure be performed?:   [facility]     R [id number] 10/12/21, 11/18/21, R L5+ S1 TF ESI 07/12/22, 08/25/22   Return in about 8 days (around 07/25/2024) for Right L5 and S1 TF ESI, in clinic [facility].    Recent Visits No visits were found meeting these conditions. Showing recent visits within past 90 days and meeting all other requirements Today's Visits Date Type Provider Dept  07/17/24 Office Visit Marcelino Nurse, MD [facility]  Showing today's visits and meeting all other requirements Future Appointments No visits were found meeting these conditions. Showing future appointments within next 90 days and meeting all other requirements  I discussed the assessment and treatment plan with the patient. The patient was provided an opportunity to ask questions and all were answered. The patient agreed with the plan and demonstrated an understanding of the instructions.  Patient advised to call back or seek an in-person evaluation if the symptoms or condition worsens.  I personally spent a total of 30 minutes in the care of the patient today including preparing to see the patient, getting/reviewing separately obtained history, performing a medically appropriate exam/evaluation, counseling and educating, placing orders, and documenting clinical information in the EHR.   Note by: Nurse Marcelino, MD (TTS  and AI technology used. I apologize for any typographical errors that were not detected and corrected.) Date: 07/17/2024; Time: 4:31 PM

## 2024-07-17 NOTE — Progress Notes (Signed)
 Safety precautions to be maintained throughout the outpatient stay will include: orient to surroundings, keep bed in low position, maintain call bell within reach at all times, provide assistance with transfer out of bed and ambulation.

## 2024-07-17 NOTE — Patient Instructions (Signed)

## 2024-07-30 ENCOUNTER — Ambulatory Visit: Admitting: Student in an Organized Health Care Education/Training Program

## 2024-08-01 ENCOUNTER — Ambulatory Visit: Admitting: Student in an Organized Health Care Education/Training Program

## 2024-08-01 ENCOUNTER — Ambulatory Visit
Admission: RE | Admit: 2024-08-01 | Discharge: 2024-08-01 | Disposition: A | Discharge status: Home / Self Care | Source: Ambulatory Visit | Attending: Student in an Organized Health Care Education/Training Program | Admitting: Student in an Organized Health Care Education/Training Program

## 2024-08-01 ENCOUNTER — Encounter: Payer: Self-pay | Admitting: Student in an Organized Health Care Education/Training Program

## 2024-08-01 DIAGNOSIS — M5417 Radiculopathy, lumbosacral region: Secondary | ICD-10-CM | POA: Diagnosis not present

## 2024-08-01 DIAGNOSIS — M5416 Radiculopathy, lumbar region: Secondary | ICD-10-CM | POA: Insufficient documentation

## 2024-08-01 DIAGNOSIS — M5126 Other intervertebral disc displacement, lumbar region: Secondary | ICD-10-CM | POA: Insufficient documentation

## 2024-08-01 MED ORDER — LIDOCAINE HCL 2 % IJ SOLN
20.0000 mL | Freq: Once | INTRAMUSCULAR | Status: AC
  Administered 2024-08-01: 400 mg
  Filled 2024-08-01: qty 20

## 2024-08-01 MED ORDER — DEXAMETHASONE SOD PHOSPHATE PF 10 MG/ML IJ SOLN
20.0000 mg | Freq: Once | INTRAMUSCULAR | Status: AC
  Administered 2024-08-01: 20 mg
  Filled 2024-08-01: qty 2

## 2024-08-01 MED ORDER — SODIUM CHLORIDE 0.9% FLUSH
2.0000 mL | Freq: Once | INTRAVENOUS | Status: AC
  Administered 2024-08-01: 2 mL

## 2024-08-01 MED ORDER — ROPIVACAINE HCL 2 MG/ML IJ SOLN
2.0000 mL | Freq: Once | INTRAMUSCULAR | Status: AC
  Administered 2024-08-01: 2 mL via EPIDURAL
  Filled 2024-08-01: qty 20

## 2024-08-01 MED ORDER — IOHEXOL 180 MG/ML  SOLN
10.0000 mL | Freq: Once | INTRAMUSCULAR | Status: AC
  Administered 2024-08-01: 10 mL via EPIDURAL
  Filled 2024-08-01: qty 20

## 2024-08-01 NOTE — Progress Notes (Signed)
 PROVIDER NOTE: Interpretation of information contained herein should be left to medically-trained personnel. Specific patient instructions are provided elsewhere under Patient Instructions section of medical record. This document was created in part using STT-dictation technology, any transcriptional errors that may result from this process are unintentional.  Patient: Debra Morales Type: Established DOB: 07-06-1985 MRN: [id number] PCP: Jyl Railing, MD  Service: Procedure DOS: 08/01/2024 Setting: Ambulatory Location: Ambulatory outpatient facility Delivery: Face-to-face Provider: Wallie Sherry, MD Specialty: Interventional Pain Management Specialty designation: 09 Location: Outpatient facility Ref. Prov.: Sherry Wallie, MD    Primary Reason for Visit: Interventional Pain Management Treatment. CC: Back Pain (Right, lower)   Procedure:           Type: Trans-Foraminal Epidural Steroid Injection (Lumbar) #4  Laterality: Right  Level: L5 & S1  Imaging: Fluoroscopic guidance Anesthesia: Local anesthesia (1-2% Lidocaine ) DOS: 08/01/2024  Performed by: Wallie Sherry, MD  Purpose: Diagnostic/Therapeutic Indications: Lumbar radicular pain severe enough to impact quality of life or function. 1. Lumbar radiculopathy   2. Protrusion of lumbar intervertebral disc   3. Lumbosacral radiculopathy at L5     NAS-11 Pain score:   Pre-procedure: 8 /10   Post-procedure: 8 /10     Position / Prep / Materials:  Position: Prone  Prep solution: DuraPrep (Iodine Povacrylex [0.7% available iodine] and Isopropyl Alcohol, 74% w/w) Prep Area: Entire Posterior Lumbosacral Area.  From the lower tip of the scapula down to the tailbone and from flank to flank. Materials:  Tray: Block Needle(s):  Type: Spinal  Gauge (G): 22  Length: 5-in  Qty: 1  Pre-op H&P Assessment:  Debra Morales is a 39 y.o. (year old), female patient, seen today for interventional treatment. She  has a past surgical  history that includes Laparoscopic gastric sleeve resection (2016). Debra Morales has a current medication list which includes the following prescription(s): acetaminophen , albuterol, ascorbic acid, excedrin migraine, bupropion, cholecalciferol, clonazepam, duloxetine, ferrous sulfate, fexofenadine, emgality, hydrochlorothiazide, ibuprofen, liletta (52 mg), multi-vitamin, naltrexone, naproxen, omeprazole, potassium chloride, trazodone, valsartan, vraylar, azelastine, gabapentin , junel 1.5/30, nortriptyline , nortriptyline , ondansetron , prednisone , and zepbound. Her primarily concern today is the Back Pain (Right, lower)  Initial Vital Signs:  Pulse/HCG Rate: 89ECG Heart Rate: 88 Temp: 98.1 F (36.7 C) Resp: 18 BP:  (!) 141/85 SpO2: 96 %  BMI: Estimated body mass index is 58.36 kg/m as calculated from the following:   Height as of this encounter: 5' 4 (1.626 m).   Weight as of this encounter: 340 lb (154.2 kg).  Risk Assessment: Allergies: Reviewed. She is allergic to covid-19 (mrna) vaccine, lactose, other, measles and rubella virus vaccine, and reglan [metoclopramide].  Allergy Precautions: None required Coagulopathies: Reviewed. None identified.  Blood-thinner therapy: None at this time Active Infection(s): Reviewed. None identified. Debra Morales is afebrile  Site Confirmation: Debra Morales was asked to confirm the procedure and laterality before marking the site Procedure checklist: Completed Consent: Before the procedure and under the influence of no sedative(s), amnesic(s), or anxiolytics, the patient was informed of the treatment options, risks and possible complications. To fulfill our ethical and legal obligations, as recommended by the American Medical Association's Code of Ethics, I have informed the patient of my clinical impression; the nature and purpose of the treatment or procedure; the risks, benefits, and possible complications of the intervention; the alternatives, including  doing nothing; the risk(s) and benefit(s) of the alternative treatment(s) or procedure(s); and the risk(s) and benefit(s) of doing nothing. The patient was provided information about the general risks and possible  complications associated with the procedure. These may include, but are not limited to: failure to achieve desired goals, infection, bleeding, organ or nerve damage, allergic reactions, paralysis, and death. In addition, the patient was informed of those risks and complications associated to Spine-related procedures, such as failure to decrease pain; infection (i.e.: Meningitis, epidural or intraspinal abscess); bleeding (i.e.: epidural hematoma, subarachnoid hemorrhage, or any other type of intraspinal or peri-dural bleeding); organ or nerve damage (i.e.: Any type of peripheral nerve, nerve root, or spinal cord injury) with subsequent damage to sensory, motor, and/or autonomic systems, resulting in permanent pain, numbness, and/or weakness of one or several areas of the body; allergic reactions; (i.e.: anaphylactic reaction); and/or death. Furthermore, the patient was informed of those risks and complications associated with the medications. These include, but are not limited to: allergic reactions (i.e.: anaphylactic or anaphylactoid reaction(s)); adrenal axis suppression; blood sugar elevation that in diabetics may result in ketoacidosis or comma; water retention that in patients with history of congestive heart failure may result in shortness of breath, pulmonary edema, and decompensation with resultant heart failure; weight gain; swelling or edema; medication-induced neural toxicity; particulate matter embolism and blood vessel occlusion with resultant organ, and/or nervous system infarction; and/or aseptic necrosis of one or more joints. Finally, the patient was informed that Medicine is not an exact science; therefore, there is also the possibility of unforeseen or unpredictable risks and/or  possible complications that may result in a catastrophic outcome. The patient indicated having understood very clearly. We have given the patient no guarantees and we have made no promises. Enough time was given to the patient to ask questions, all of which were answered to the patient's satisfaction. Ms. Leaf has indicated that she wanted to continue with the procedure. Attestation: I, the ordering provider, attest that I have discussed with the patient the benefits, risks, side-effects, alternatives, likelihood of achieving goals, and potential problems during recovery for the procedure that I have provided informed consent. Date  Time: 08/01/2024  1:55 PM  Pre-Procedure Preparation:  Monitoring: As per clinic protocol. Respiration, ETCO2, SpO2, BP, heart rate and rhythm monitor placed and checked for adequate function Safety Precautions: Patient was assessed for positional comfort and pressure points before starting the procedure. Time-out: I initiated and conducted the Time-out before starting the procedure, as per protocol. The patient was asked to participate by confirming the accuracy of the Time Out information. Verification of the correct person, site, and procedure were performed and confirmed by me, the nursing staff, and the patient. Time-out conducted as per Joint Commission's Universal Protocol (UP.01.01.01). Time: 1445  Description/Narrative of Procedure:          Target: The 6 o'clock position under the pedicle, on the affected side for right L5 and S1 Region: Posterolateral Lumbosacral Approach: Posterior Percutaneous Paravertebral approach.  Rationale (medical necessity): procedure needed and proper for the diagnosis and/or treatment of the patient's medical symptoms and needs. Procedural Technique Safety Precautions: Aspiration looking for blood return was conducted prior to all injections. At no point did we inject any substances, as a needle was being advanced. No  attempts were made at seeking any paresthesias. Safe injection practices and needle disposal techniques used. Medications properly checked for expiration dates. SDV (single dose vial) medications used. Description of the Procedure: Protocol guidelines were followed. The patient was placed in position over the procedure table. The target area was identified and the area prepped in the usual manner. Skin & deeper tissues infiltrated with local anesthetic. Appropriate  amount of time allowed to pass for local anesthetics to take effect. The procedure needles were then advanced to the target area. Proper needle placement secured. Negative aspiration confirmed. Solution injected in intermittent fashion, asking for systemic symptoms every 0.5cc of injectate. The needles were then removed and the area cleansed, making sure to leave some of the prepping solution back to take advantage of its long term bactericidal properties.  5 cc solution made of 2 cc of preservative-free saline, 1 cc of 0.2% ropivacaine , 2 cc of Decadron  10 mg/cc. 2.5 cc injected for right L5 and right S1 nerve   Vitals:   08/01/24 1412 08/01/24 1445 08/01/24 1500  BP: (!) 141/85 (!) 164/102 (!) 158/101  Pulse: 89    Resp: 18 16 16   Temp: 98.1 F (36.7 C)    TempSrc: Temporal    SpO2: 96% 97% 98%  Weight: (!) 340 lb (154.2 kg)    Height: 5' 4 (1.626 m)       Start Time: 1446 hrs. End Time: 1455 hrs.  Imaging Guidance (Spinal):          Type of Imaging Technique: Fluoroscopy Guidance (Spinal) Indication(s): Assistance in needle guidance and placement for procedures requiring needle placement in or near specific anatomical locations not easily accessible without such assistance. Exposure Time: Please see nurses notes. Contrast: Before injecting any contrast, we confirmed that the patient did not have an allergy to iodine, shellfish, or radiological contrast. Once satisfactory needle placement was completed at the desired level,  radiological contrast was injected. Contrast injected under live fluoroscopy. No contrast complications. See chart for type and volume of contrast used. Fluoroscopic Guidance: I was personally present during the use of fluoroscopy. Tunnel Vision Technique used to obtain the best possible view of the target area. Parallax error corrected before commencing the procedure. Direction-depth-direction technique used to introduce the needle under continuous pulsed fluoroscopy. Once target was reached, antero-posterior, oblique, and lateral fluoroscopic projection used confirm needle placement in all planes. Images permanently stored in EMR. Interpretation: I personally interpreted the imaging intraoperatively. Adequate needle placement confirmed in multiple planes. Appropriate spread of contrast into desired area was observed. No evidence of afferent or efferent intravascular uptake. No intrathecal or subarachnoid spread observed. Permanent images saved into the patient's record.   Post-operative Assessment:  Post-procedure Vital Signs:  Pulse/HCG Rate: 8986 Temp: 98.1 F (36.7 C) Resp: 16 BP:  (!) 158/101 SpO2: 98 %  EBL: None  Complications: No immediate post-treatment complications observed by team, or reported by patient.  Note: The patient tolerated the entire procedure well. A repeat set of vitals were taken after the procedure and the patient was kept under observation following institutional policy, for this type of procedure. Post-procedural neurological assessment was performed, showing return to baseline, prior to discharge. The patient was provided with post-procedure discharge instructions, including a section on how to identify potential problems. Should any problems arise concerning this procedure, the patient was given instructions to immediately contact us , at any time, without hesitation. In any case, we plan to contact the patient by telephone for a follow-up status report regarding  this interventional procedure.  Comments:  No additional relevant information.  5 out of 5 strength bilateral lower extremity: Plantar flexion, dorsiflexion, knee flexion, knee extension.   Plan of Care  Orders:  Orders Placed This Encounter  Procedures   DG PAIN CLIN[facility] C-ARM 1-60 MIN NO REPORT    Intraoperative interpretation by procedural physician at [facility].    Standing  Status:   Standing    Number of Occurrences:   1    Reason for exam::   Assistance in needle guidance and placement for procedures requiring needle placement in or near specific anatomical locations not easily accessible without such assistance.    Medications ordered for procedure: Meds ordered this encounter  Medications   iohexol  (OMNIPAQUE ) 180 MG/ML injection 10 mL    Must be Myelogram-compatible. If not available, you may substitute with a water-soluble, non-ionic, hypoallergenic, myelogram-compatible radiological contrast medium.   lidocaine  (XYLOCAINE ) 2 % (with pres) injection 400 mg   dexamethasone  (DECADRON ) injection 20 mg    This is for a two (2) level block. Use two (2) syringes and divide content in half.   ropivacaine  (PF) 2 mg/mL (0.2%) (NAROPIN ) injection 2 mL    This is for a two (2) level block. Use two (2) syringes and divide content in half.   sodium chloride  flush (NS) 0.9 % injection 2 mL    This is for a two (2) level block. Use two (2) syringes and divide content in half.   Medications administered: We administered iohexol , lidocaine , dexamethasone , ropivacaine  (PF) 2 mg/mL (0.2%), and sodium chloride  flush.  See the medical record for exact dosing, route, and time of administration.  Follow-up plan:   Return in about 4 weeks (around 08/29/2024) for PPE, VV. BL.       R L5TFESI 10/12/21, 11/18/21, R L5+ S1 TF ESI 07/12/22, 08/25/22, 08/01/24  Recent Visits Date Type Provider Dept  07/17/24 Office Visit Marcelino Nurse, MD [facility]  Showing recent visits within  past 90 days and meeting all other requirements Today's Visits Date Type Provider Dept  08/01/24 Procedure visit Marcelino Nurse, MD [facility]  Showing today's visits and meeting all other requirements Future Appointments Date Type Provider Dept  08/29/24 Appointment Marcelino Nurse, MD [facility]  Showing future appointments within next 90 days and meeting all other requirements  Disposition: Discharge home  Discharge (Date  Time): 08/01/2024;   hrs.   Primary Care Physician: Jyl Railing, MD Location: [facility] Note by: Nurse Marcelino, MD Date: 08/01/2024; Time: 3:35 PM  Disclaimer:  Medicine is not an exact science. The only guarantee in medicine is that nothing is guaranteed. It is important to note that the decision to proceed with this intervention was based on the information collected from the patient. The Data and conclusions were drawn from the patient's questionnaire, the interview, and the physical examination. Because the information was provided in large part by the patient, it cannot be guaranteed that it has not been purposely or unconsciously manipulated. Every effort has been made to obtain as much relevant data as possible for this evaluation. It is important to note that the conclusions that lead to this procedure are derived in large part from the available data. Always take into account that the treatment will also be dependent on availability of resources and existing treatment guidelines, considered by other Pain Management Practitioners as being common knowledge and practice, at the time of the intervention. For Medico-Legal purposes, it is also important to point out that variation in procedural techniques and pharmacological choices are the acceptable norm. The indications, contraindications, technique, and results of the above procedure should only be interpreted and judged by a Board-Certified Interventional Pain  Specialist with extensive familiarity and expertise in the same exact procedure and technique.

## 2024-08-01 NOTE — Patient Instructions (Signed)

## 2024-08-02 ENCOUNTER — Telehealth: Payer: Self-pay

## 2024-08-02 NOTE — Telephone Encounter (Signed)
 Post procedure follow up.  LM

## 2024-08-29 ENCOUNTER — Telehealth: Admitting: Student in an Organized Health Care Education/Training Program
